# Patient Record
Sex: Female | Born: 1985 | Race: White | Hispanic: No | Marital: Married | Smoking: Never smoker
Health system: Southern US, Community
[De-identification: ages and names within clinical notes are randomized; demographics above are authoritative.]

## PROBLEM LIST (undated history)

## (undated) DIAGNOSIS — E282 Polycystic ovarian syndrome: Secondary | ICD-10-CM

## (undated) DIAGNOSIS — E119 Type 2 diabetes mellitus without complications: Secondary | ICD-10-CM

## (undated) DIAGNOSIS — T8859XA Other complications of anesthesia, initial encounter: Secondary | ICD-10-CM

## (undated) DIAGNOSIS — N979 Female infertility, unspecified: Secondary | ICD-10-CM

## (undated) HISTORY — DX: Female infertility, unspecified: N97.9

## (undated) HISTORY — DX: Type 2 diabetes mellitus without complications: E11.9

## (undated) HISTORY — DX: Polycystic ovarian syndrome: E28.2

## (undated) HISTORY — DX: Other complications of anesthesia, initial encounter: T88.59XA

## (undated) HISTORY — PX: DILATION AND CURETTAGE OF UTERUS: SHX78

---

## 2020-05-23 ENCOUNTER — Other Ambulatory Visit: Payer: Self-pay

## 2020-06-08 ENCOUNTER — Other Ambulatory Visit: Payer: Self-pay | Admitting: Obstetrics and Gynecology

## 2020-06-08 DIAGNOSIS — O24112 Pre-existing diabetes mellitus, type 2, in pregnancy, second trimester: Secondary | ICD-10-CM

## 2020-06-11 ENCOUNTER — Other Ambulatory Visit: Payer: Self-pay

## 2020-06-11 ENCOUNTER — Ambulatory Visit: Payer: BC Managed Care – PPO

## 2020-06-11 DIAGNOSIS — O24112 Pre-existing diabetes mellitus, type 2, in pregnancy, second trimester: Secondary | ICD-10-CM

## 2020-06-19 ENCOUNTER — Other Ambulatory Visit: Payer: Self-pay

## 2020-07-03 ENCOUNTER — Other Ambulatory Visit: Payer: Self-pay | Admitting: Obstetrics and Gynecology

## 2020-07-03 DIAGNOSIS — Z363 Encounter for antenatal screening for malformations: Secondary | ICD-10-CM

## 2020-07-08 ENCOUNTER — Encounter: Payer: Self-pay | Admitting: *Deleted

## 2020-07-09 ENCOUNTER — Other Ambulatory Visit: Payer: Self-pay

## 2020-07-09 ENCOUNTER — Ambulatory Visit: Payer: BC Managed Care – PPO | Attending: Obstetrics and Gynecology | Admitting: Genetic Counselor

## 2020-07-09 ENCOUNTER — Ambulatory Visit: Payer: BC Managed Care – PPO | Attending: Obstetrics and Gynecology

## 2020-07-09 ENCOUNTER — Encounter: Payer: Self-pay | Admitting: *Deleted

## 2020-07-09 ENCOUNTER — Ambulatory Visit: Payer: BC Managed Care – PPO | Admitting: *Deleted

## 2020-07-09 VITALS — BP 129/78 | HR 109 | Ht 62.0 in

## 2020-07-09 DIAGNOSIS — Z3A20 20 weeks gestation of pregnancy: Secondary | ICD-10-CM

## 2020-07-09 DIAGNOSIS — O283 Abnormal ultrasonic finding on antenatal screening of mother: Secondary | ICD-10-CM | POA: Insufficient documentation

## 2020-07-09 DIAGNOSIS — Z363 Encounter for antenatal screening for malformations: Secondary | ICD-10-CM | POA: Insufficient documentation

## 2020-07-09 DIAGNOSIS — O99212 Obesity complicating pregnancy, second trimester: Secondary | ICD-10-CM | POA: Diagnosis not present

## 2020-07-09 DIAGNOSIS — O24112 Pre-existing diabetes mellitus, type 2, in pregnancy, second trimester: Secondary | ICD-10-CM

## 2020-07-09 DIAGNOSIS — E669 Obesity, unspecified: Secondary | ICD-10-CM

## 2020-07-09 DIAGNOSIS — O358XX0 Maternal care for other (suspected) fetal abnormality and damage, not applicable or unspecified: Secondary | ICD-10-CM

## 2020-07-09 DIAGNOSIS — Z315 Encounter for genetic counseling: Secondary | ICD-10-CM

## 2020-07-09 NOTE — Progress Notes (Signed)
ADDENDUM (07/10/20): I called Ms. Zetino to check in on her following our discussion yesterday. Ms. Wales informed me that she and her husband have decided not to undergo amniocentesis and will be opting to end the pregnancy. Ultimately, Ms. Tsutsui did not want her son to have poor quality of life. The uncertainty surrounding his prognosis left her needing to consider and make decisions based upon the "worst case scenario". She especially had concerns about impacts on his learning and development, which unfortunately cannot be predicted during the prenatal period. I validated Ms. Osinski thoughts and feelings and reinforced that she is the one who knows the best decision that can be made under these circumstances.   Ms. Rekowski inquired about the next steps in the process of terminating the pregnancy. We discussed that termination of pregnancy may occur through induction of labor or a dilation & evacuation (D&E) procedure. We reviewed the technical aspects of the D&E and induction procedures as well as associated benefits, limitations, and risks associated with each method. We also discussed locations around [place] that perform termination of pregnancy procedures. Ms. Aja opted to have a D&E with Dr. Sabra Heck at [facility]. She gave me permission to reach out to [facility] and provide them with her contact information to schedule an appointment for the procedure.   I offered Ms. Fetting support resources for individuals who have chosen to terminate a wanted pregnancy for medical reasons, which she expressed interest in. Following our phone call, I emailed her information about the websites A Heartbreaking Choice and Ending a Wanted Pregnancy. Ms. Esty confirmed that she had no further questions. I encouraged her to contact me if there is anything else I can do to be helpful during this difficult  time.  ----------------------------------------------------------------------------------------------------------------------  07/08/2020  Annamaria Helling [birth year] MRN: [id number] DOV: 07/09/2020  Ms. Kaluzny presented to the [facility] for a genetics consultation regarding fetal anomalies identified on ultrasound. Ms. Vanvorst was accompanied to the latter half of her appointment by her husband.   Indication for genetic counseling - Fetal anomalies identified on ultrasound  Prenatal history  Ms. Lainez is a G61P1011, 35 y.o. female. Her current pregnancy has completed [redacted]w[redacted]d(Estimated Date of Delivery: 11/25/20). Ms. SChovanand her husband have one healthy daughter together. Ms. SLangsamhas also had one prior miscarriage requiring a D&E. Given the nature of today's appointment, additional prenatal history was not reviewed in detail.   Family History  Given the nature of today's appointment, a three generation pedigree was not drafted and comprehensive family history was not reviewed. The patient and her husband are both Caucasian.  Discussion  I met with Ms. Cueto at Dr. BShon Batonrequest to discuss findings from her anatomy ultrasound. A complete ultrasound was performed today prior to our visit. The ultrasound report will be sent under separate cover. An absent right hand, fetal growth restriction, micrognathia, a membranous ventricular septal defect, polyhydramnios, and a marginal cord insertion were identified on today's ultrasound.  Possible causes:  Ms. SAlongewas counseled that since multiple anomalies are seen in conjunction with one another on ultrasound, it is possible that the fetus may have an underlying genetic condition. We discussed possible differentials that can be associated with the findings on ultrasound. One condition on the list of differential diagnoses is Cornelia de Lange syndrome (CdLS). We discussed that this is a condition characterized by  both pre- and postnatal growth delays, abnormalities of the head and face (including micrognathia), upper  limb malformations (including absent hands), and intellectual disabilities ranging from mild to severe. Infants with CdLS may also have feeding and breathing difficulties, an increased susceptibility to respiratory infections, congenital heart defects, delayed skeletal maturation, hearing loss, or other physical abnormalities. The range and severity of associated symptoms may be extremely variable from one affected person to another. We discussed that severity often cannot be predicted during the prenatal period.   CdLS is caused by changes in several different genes. Approximately 60% of individuals with CdLS have pathogenic variants in the NIPBL gene, with 10% having pathogenic variants in other genes. Approximately 30% of individuals clinically diagnosed with CdLS do not have a pathogenic variant in one of the known genes, suggesting that other genes may be found to be associated with CdLS in the future. CdLS most often occurs de novo, meaning that an individual is the first one in their family to be affected. CdLS can be inherited in an autosomal dominant or X-linked fashion.  We reviewed that the features on today's ultrasound overlap with many features of CdLS. However, this condition must be diagnosed clinically after birth or through molecular testing. Additionally, we discussed that it is possible that the features identified on today's ultrasound could be related to a genetic condition other than CdLS, such as a chromosomal abnormality. Ms. Riedlinger understands that prognosis depends on the underlying cause of the ultrasound anomalies.  Diagnostic testing:  Ms. Strieter was counseled regarding diagnostic testing via amniocentesis. We discussed the technical aspects of the procedure and quoted up to a 1 in 500 (0.2%) risk for spontaneous pregnancy loss or other adverse pregnancy outcomes as a result  of amniocentesis. Cultured cells from an amniocentesis sample allow for the visualization of a fetal karyotype, which can detect >99% of large chromosomal aberrations. Chromosomal microarray can also be performed to identify smaller deletions or duplications of fetal chromosomal material. Amniocentesis could also be performed to assess whether the fetus is affected by a single gene condition via a gene sequencing panel. For example, a multigene panel containing genes associated with CdLS could be ordered.   We also briefly discussed the possibility of a whole exome sequencing research study offered by [facility]. Whole exome sequencing (WES) is a technology that sequences every protein-coding gene in the body. We discussed that if chromosomal testing were negative, WES would be the follow-up option with the best chance at finding a genetic etiology for the fetus's ultrasound findings. If Ms. Paulette were interested in this study and wanted to pursue an amniocentesis, we could sign a release form for a portion of her amniotic fluid to be sent from [facility] to [facility] for free WES analysis.   Ms. Layne was counseled on the benefits of amniocentesis. We discussed that if results were to come back positive, it could impact pregnancy management in several different ways. We discussed that some individuals may choose to end a pregnancy or consider adoption if a genetic condition were confirmed in the fetus. For individuals who would not alter their pregnancy management regardless of testing outcomes, a prenatal diagnosis could allow for delivery planing and prenatal consults with specialists that would be involved in the infant's care, and time to plan and prepare emotionally, physically, and financially. Additionally, diagnostic testing could help to inform recurrence risks for future pregnancies if a genetic etiology were identified.  Ms. Meadowcroft was also counseled on the limitations of amniocentesis. We discussed that  negative results on an amniocentesis would not rule out a genetic condition for  the fetus. Chromosomal testing and a multigene sequencing panel cannot assess for all possible genetic conditions that may be associated with the ultrasound findings. Additionally, a negative result on a CdSL-specific multigene panel would not rule out a diagnosis of CdLS since 30% of affected individuals have no known genetic cause. Finally, results from amniocentesis would likely not be returned in time for Ms. Mynhier to decide to terminate a pregnancy in the state of [place].   Pregnancy management options:  We reviewed Ms. Wallington's pregnancy management options in detail. Firstly, she has the option of continuing the pregnancy. Secondly, Ms. Hann has the option of pursuing adoption. Lastly, Ms. Yan has the option of ending the pregnancy. Ms. Wake was informed that it is an option to end a pregnancy until 20-21 weeks' gestation in the state of [place] depending on the clinic. Termination is still an option beyond 21 weeks' gestation in other states. Finally, we reviewed [place]'s 72 hour consent law for termination procedures.   Ms. Fabry disclosed that she is leaning toward terminating the pregnancy. This is an extremely difficult decision for her, as this pregnancy is deeply desired. Ms. Kimm and her husband have been trying to conceive for eight years. However, she must consider the unknowns surrounding her baby's possible quality of life/prognosis in addition to the impacts that caring for a child with special needs would have on her daughter and the rest of her family. Ms. Knock signed the consent form for the Women's Right to Know Act in case she decides to pursue termination of pregnancy.   Plan:  Ms. Cogle was appropriately emotional today. She and her husband requested to go home and process the information presented today before making a definitive decision regarding amniocentesis or  termination of pregnancy. While she is considering both amniocentesis and termination of pregnancy, she is struggling with making the "wrong" decision. We made a plan for me to call Ms. Mette tomorrow to check in on how she's feeling and see if she had made any decisions or had further questions after having some time to further consider her options.  I provided Ms. Cregger with resources on Cornelia de Lange syndrome at her request, again stressing that this is not the definitive diagnosis for the fetus.   I counseled Ms. Kloth regarding the above risks and available options. The approximate face-to-face time with the genetic counselor was 40 minutes.  In summary:  Reviewed results of ultrasound  Absent right hand, fetal growth restriction, micrognathia, VSD, polyhydramnios, and marginal cord insertion identified  Discussed ultrasound findings, including information about possible causes and prognosis  Genetic syndrome such as Cornelia de Lange or chromosomal abnormality possible  Prognosis depends on underlying cause and may not be able to be determined prenatally  Offered additional testing and screening  Considering amniocentesis and termination of pregnancy. Results from amniocentesis likely will not be available before deadline for termination of pregnancy in [place]. I will contact patient tomorrow to see if she has made any decisions    Buelah Manis, MS, [place]

## 2021-03-30 LAB — OB RESULTS CONSOLE HIV ANTIBODY (ROUTINE TESTING): HIV: NONREACTIVE

## 2021-03-30 LAB — OB RESULTS CONSOLE RUBELLA ANTIBODY, IGM: Rubella: IMMUNE

## 2021-03-30 LAB — OB RESULTS CONSOLE VARICELLA ZOSTER ANTIBODY, IGG: Varicella: IMMUNE

## 2021-03-30 LAB — OB RESULTS CONSOLE GC/CHLAMYDIA
Chlamydia: NEGATIVE
Neisseria Gonorrhea: NEGATIVE

## 2021-03-30 LAB — OB RESULTS CONSOLE HEPATITIS B SURFACE ANTIGEN: Hepatitis B Surface Ag: NEGATIVE

## 2021-05-18 ENCOUNTER — Other Ambulatory Visit: Payer: Self-pay | Admitting: Obstetrics and Gynecology

## 2021-05-18 DIAGNOSIS — Z363 Encounter for antenatal screening for malformations: Secondary | ICD-10-CM

## 2021-05-20 ENCOUNTER — Encounter: Payer: Self-pay | Admitting: *Deleted

## 2021-05-27 ENCOUNTER — Other Ambulatory Visit: Payer: Self-pay

## 2021-05-27 ENCOUNTER — Ambulatory Visit: Payer: BC Managed Care – PPO | Attending: Obstetrics and Gynecology

## 2021-05-27 ENCOUNTER — Ambulatory Visit: Payer: BC Managed Care – PPO | Admitting: *Deleted

## 2021-05-27 ENCOUNTER — Other Ambulatory Visit: Payer: Self-pay | Admitting: *Deleted

## 2021-05-27 ENCOUNTER — Encounter: Payer: Self-pay | Admitting: *Deleted

## 2021-05-27 ENCOUNTER — Ambulatory Visit (HOSPITAL_BASED_OUTPATIENT_CLINIC_OR_DEPARTMENT_OTHER): Payer: BC Managed Care – PPO | Admitting: Obstetrics and Gynecology

## 2021-05-27 VITALS — BP 124/69 | HR 89

## 2021-05-27 DIAGNOSIS — O24312 Unspecified pre-existing diabetes mellitus in pregnancy, second trimester: Secondary | ICD-10-CM | POA: Diagnosis not present

## 2021-05-27 DIAGNOSIS — O24112 Pre-existing diabetes mellitus, type 2, in pregnancy, second trimester: Secondary | ICD-10-CM | POA: Diagnosis present

## 2021-05-27 DIAGNOSIS — O3503X0 Maternal care for (suspected) central nervous system malformation or damage in fetus, choroid plexus cysts, not applicable or unspecified: Secondary | ICD-10-CM

## 2021-05-27 DIAGNOSIS — O09299 Supervision of pregnancy with other poor reproductive or obstetric history, unspecified trimester: Secondary | ICD-10-CM

## 2021-05-27 DIAGNOSIS — O24419 Gestational diabetes mellitus in pregnancy, unspecified control: Secondary | ICD-10-CM

## 2021-05-27 DIAGNOSIS — Z363 Encounter for antenatal screening for malformations: Secondary | ICD-10-CM

## 2021-05-27 DIAGNOSIS — Z3A18 18 weeks gestation of pregnancy: Secondary | ICD-10-CM | POA: Insufficient documentation

## 2021-05-27 DIAGNOSIS — O99212 Obesity complicating pregnancy, second trimester: Secondary | ICD-10-CM | POA: Diagnosis not present

## 2021-05-27 DIAGNOSIS — R638 Other symptoms and signs concerning food and fluid intake: Secondary | ICD-10-CM

## 2021-05-27 DIAGNOSIS — Z3689 Encounter for other specified antenatal screening: Secondary | ICD-10-CM

## 2021-05-27 NOTE — Progress Notes (Signed)
Maternal-Fetal Medicine   Name: Caitlin Downs DOB: 09-01-1985 MRN: [id number] Referring Provider: Rhoderick Moody, MD  I had the [facility]. She is G4 P1021 at 19w 5d gestation and is here for fetal anatomy scan and consultation. Her problems include: -Pregestational diabetes. -History of fetal anomaly  Past medical history significant for type 2 diabetes.  In November 2020, her hemoglobin A1c was 9.4%.  Her most recent hemoglobin A1c (September 2022) is 5.5%.  Patient takes metformin XR 500 mg daily and reports her fasting and postprandial levels are within normal range.  She checks her blood glucose regularly.  Patient had ophthalmology examination last year and does not have proliferative retinopathy.  She does not have neuropathy or nephropathy. She does not have hypertension or thyroid disorders or any other chronic medical conditions. Past surgical history: DND Medications: Prenatal vitamins, metformin, Zyrtec as needed. Allergies: No known drug allergies. Social history: Denies tobacco or drug or alcohol use.  She has been married 10 years and her husband is in good health.  He is a father of her first child. Family history: No history of venous thromboembolism in the family. Obstetric history 06/2007: Term vaginal delivery of a female infant weighing 6 pounds and 2 ounces at birth.  Her daughter is in good health. -In March 2022 at [redacted] weeks gestation, multiple fetal anomalies including micrognathia, ventricular septal defect, polyhydramnios, absent right ulnar and radius were seen.  Patient had opted not to have amniocentesis.  She had D&E procedure performed at [facility].  She declined postnatal genetic studies. GYN history: No history of abnormal Pap smears or cervical surgeries.  Prenatal course: Her pregnancy is well dated by 7-week ultrasound performed at your office.  On cell free fetal DNA  screening, the risks of fetal aneuploidies are not increased.  Blood pressure today at her office is 124/69 mmHg.  Ultrasound We performed fetal anatomical survey.  Amniotic fluid is normal and good fetal activity seen.  Fetal biometry is consistent with the previously established dates.  Bilateral choroid plexus cysts were seen.  No other markers of aneuploidies or fetal structural defects are seen.  Both arms were visible and appeared normal.  Fetal profile appears normal.  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  Increased hemoglobin A1c in November 2020 clearly establishes pregestational diabetes and not gestational diabetes. -I reassured the patient that low hemoglobin A1c is associated with a lower incidence of congenital malformations (2% to 3%).  I recommended fetal echocardiography.  Patient reports that your office had set up an appointment for fetal echocardiography. -I discussed the importance of checking her blood glucose regularly and discussed the normal parameters.  Patient seems very well motivated. -Complications of poorly controlled diabetes include fetal macrosomia leading to shoulder dystocia and birth injuries, neonatal respiratory distress syndrome and NICU admissions.  Poorly controlled diabetes can also lead to stillbirth. -Metformin can be safely given in pregnancy and if diabetes is not well controlled on oral hypoglycemics, insulin should be initiated. -I discussed our ultrasound protocol of serial fetal growth assessments and weekly BPP from [redacted] weeks gestation till delivery. -Delivery should be considered at [redacted] weeks gestation provided diabetes is well controlled.  If diabetes is not well controlled, early term delivery (37- or 38-weeks gestation) may be considered. -Type 2 diabetes associated with increased risk of gestational hypertension/preeclampsia.  I discussed the benefit of low-dose aspirin in delaying or preventing preeclampsia.  I encouraged her to take aspirin 81 mg daily till delivery.  Bilateral choroid plexus cysts (CPC) I counseled the patient that isolated CPC is only rarely associated with chromosomal anomaly (trisomy 18). I also reassured her that [facility] is not associated with structural malformations in the brain. CPCs usually resolve with advancing gestation.  Given that she had low risk for trisomy 18 on cell free fetal DNA screening, this should not be considered a marker for trisomy 89.  I do not recommend amniocentesis for this finding. I reassured the patient of normal otherwise fetal anatomical survey today.  Recommendations -An appointment was made for her to return in 4 weeks for fetal growth assessment and revisit facial anatomy (normal but suboptimal views on today's ultrasound). -Fetal growth assessments every 4 weeks that may be performed at your office. -Weekly BPP from [redacted] weeks gestation till delivery. -Aspirin 81 mg daily till delivery. -Your office as requested appointment for fetal echocardiography.  Kindly fax [place] the reports when available.  Thank you for consultation.  If you have any questions or concerns, please contact me the [facility].  Consultation including face-to-face (more than 50%) counseling 30 minutes.

## 2021-06-21 ENCOUNTER — Ambulatory Visit: Payer: BC Managed Care – PPO | Admitting: *Deleted

## 2021-06-21 ENCOUNTER — Other Ambulatory Visit: Payer: Self-pay

## 2021-06-21 ENCOUNTER — Other Ambulatory Visit: Payer: Self-pay | Admitting: *Deleted

## 2021-06-21 ENCOUNTER — Encounter: Payer: Self-pay | Admitting: *Deleted

## 2021-06-21 ENCOUNTER — Ambulatory Visit: Payer: BC Managed Care – PPO | Attending: Obstetrics and Gynecology

## 2021-06-21 VITALS — BP 124/72 | HR 91

## 2021-06-21 DIAGNOSIS — Z6833 Body mass index (BMI) 33.0-33.9, adult: Secondary | ICD-10-CM

## 2021-06-21 DIAGNOSIS — O24419 Gestational diabetes mellitus in pregnancy, unspecified control: Secondary | ICD-10-CM | POA: Diagnosis present

## 2021-06-21 DIAGNOSIS — O3503X0 Maternal care for (suspected) central nervous system malformation or damage in fetus, choroid plexus cysts, not applicable or unspecified: Secondary | ICD-10-CM | POA: Insufficient documentation

## 2021-06-21 DIAGNOSIS — Z3689 Encounter for other specified antenatal screening: Secondary | ICD-10-CM

## 2021-06-21 DIAGNOSIS — O24119 Pre-existing diabetes mellitus, type 2, in pregnancy, unspecified trimester: Secondary | ICD-10-CM

## 2021-06-21 DIAGNOSIS — O24112 Pre-existing diabetes mellitus, type 2, in pregnancy, second trimester: Secondary | ICD-10-CM

## 2021-06-21 DIAGNOSIS — Z3A22 22 weeks gestation of pregnancy: Secondary | ICD-10-CM | POA: Diagnosis not present

## 2021-06-21 DIAGNOSIS — R638 Other symptoms and signs concerning food and fluid intake: Secondary | ICD-10-CM | POA: Insufficient documentation

## 2021-07-19 ENCOUNTER — Ambulatory Visit: Payer: BC Managed Care – PPO

## 2021-07-19 ENCOUNTER — Ambulatory Visit: Payer: BC Managed Care – PPO | Attending: Obstetrics and Gynecology

## 2021-08-27 ENCOUNTER — Telehealth: Payer: Self-pay

## 2021-08-27 NOTE — Telephone Encounter (Signed)
NOTES SCANNED TO REFERRAL 

## 2021-09-06 ENCOUNTER — Ambulatory Visit: Payer: BC Managed Care – PPO | Admitting: Cardiology

## 2021-09-15 ENCOUNTER — Encounter: Payer: Self-pay | Admitting: *Deleted

## 2021-09-16 NOTE — Progress Notes (Signed)
? ?ID:  Caitlin Downs, DOB [birth year], MRN [id number] ? ?PCP:  Katherina Mires, MD  ?Cardiologist:  Rex Kras, DO, [facility] (established care 09/17/2021) ? ?REASON FOR CONSULT: Cardiac evaluation.  ? ?REQUESTING PHYSICIAN:  ?Charyl Bigger, MD ?[address] ?[place],  [place] [address] ? ?Chief Complaint  ?Patient presents with  ? New Patient (Initial Visit)  ?  Establish care, cardiac evaluation  ? ? ?HPI  ?Caitlin Downs is a 36 y.o. Caucasian female whose past medical history and cardiovascular risk factors include: Polycystic ovary syndrome, non-insulin-dependent type 2 diabetes.  ? ?She is referred to the office at the request of Almquist, Earlyne Iba, MD for evaluation of cardiac evaluation. ? ?She is G4P1011 and at her 34th week of pregnancy presents to the cardiology clinic to establish care at the request of her OB/GYN.  Her first pregnancy was unremarkable and she gave birth to a very healthy baby girl.  Patient informs me that the second pregnancy was terminated due to spontaneous abortion.  In the third pregnancy the fetus was noted to have multiple cardiac anomalies and they chose to terminate the pregnancy.  She is currently 34 weeks into the current pregnancy with a tentative delivery date to be October 25, 2021. ? ?From a cardiovascular standpoint she denies any chest pain, shortness of breath, lightheadedness, dizziness, near-syncope or syncope, or heart failure symptoms. ? ?Blood pressure are well controlled per patient and patient has been cognizant with regards to her glycemic control.  She states that as she progresses in pregnancy is requiring more metformin to have adequate blood glucose level. ? ?She was evaluated by Dr. Sandria Manly, at [facility] from pediatric cardiology and underwent a fetal echo results reviewed in [place]. ? ?ALLERGIES: ?No Known Allergies ? ?MEDICATION LIST PRIOR TO VISIT: ?Current Meds  ?Medication Sig  ? acetaminophen (TYLENOL) 325 MG tablet Take 1 tablet by  mouth as needed.  ? aspirin 81 MG EC tablet Take 1 tablet by mouth daily.  ? cetirizine (ZYRTEC) 10 MG tablet Take 10 mg by mouth daily.  ? metFORMIN (GLUCOPHAGE) 500 MG tablet Take 500 mg by mouth 2 (two) times daily with a meal.  ? Prenatal Vit-Fe Fumarate-FA (PRENATAL VITAMINS PO) Take by mouth.  ?  ? ?PAST MEDICAL HISTORY: ?Past Medical History:  ?Diagnosis Date  ? Complication of anesthesia   ? Diabetes mellitus without complication ([place])   ? Type 2  ? Infertility, female   ? PCOS (polycystic ovarian syndrome)   ? ? ?PAST SURGICAL HISTORY: ?Past Surgical History:  ?Procedure Laterality Date  ? DILATION AND CURETTAGE OF UTERUS    ? ? ?FAMILY HISTORY: ?The patient family history includes Arthritis in her mother; COPD in her mother; Diabetes in her brother and father; Heart attack (age of onset: 16) in her father; Heart disease in her father and mother; Hypertension in her brother, brother, and mother. ? ?SOCIAL HISTORY:  ?The patient  reports that she has never smoked. She has never used smokeless tobacco. She reports that she does not drink alcohol and does not use drugs. ? ?REVIEW OF SYSTEMS: ?Review of Systems  ?Cardiovascular:  Negative for chest pain, cyanosis, dyspnea on exertion, leg swelling, orthopnea, palpitations, paroxysmal nocturnal dyspnea and syncope.  ?Respiratory:  Negative for shortness of breath and wheezing.   ?Hematologic/Lymphatic: Negative for bleeding problem.  ? ?PHYSICAL EXAM: ? ?  09/17/2021  ?  8:59 AM 06/21/2021  ? 10:27 AM 05/27/2021  ?  7:31 AM  ?Vitals with  BMI  ?Height 5' 2"     ?Weight 195 lbs 10 oz    ?BMI 35.77    ?Systolic 440 102 725  ?Diastolic 86 72 69  ?Pulse 102 91 89  ? ? ?CONSTITUTIONAL: Well-developed and well-nourished. No acute distress.  ?SKIN: Skin is warm and dry. No rash noted. No cyanosis. No pallor. No jaundice ?HEAD: Normocephalic and atraumatic.  ?EYES: No scleral icterus ?MOUTH/THROAT: Moist oral membranes.  ?NECK: No JVD present. No thyromegaly noted. No  carotid bruits  ?CHEST Normal respiratory effort. No intercostal retractions  ?LUNGS: Clear to auscultation bilaterally.  No stridor. No wheezes. No rales.  ?CARDIOVASCULAR: Regular, tachycardic, positive S1-S2, no murmurs rubs or gallops appreciated.  ?ABDOMINAL: Soft, gravid uterus, positive bowel sounds in all 4 quadrants, No apparent ascites.  ?EXTREMITIES: No peripheral edema, warm to touch, 2+ bilateral DP and PT pulses ?HEMATOLOGIC: No significant bruising ?NEUROLOGIC: Oriented to person, place, and time. Nonfocal. Normal muscle tone.  ?PSYCHIATRIC: Normal mood and affect. Normal behavior. Cooperative ? ?CARDIAC DATABASE: ?EKG: ?09/17/2021: Sinus  Rhythm, 100bpm, nonspecific T wave changes, without underyling injury pattern. ? ?Echocardiogram: ?06/11/2020: ?ECG: Underlying rhythm predominately sinus tachycardia followed by normal sinus. ?Normal LV systolic function with visual EF 60-65%. Left ventricle cavity is normal in size. Normal global wall motion. Normal diastolic filling pattern, normal LAP. ?No significant valvular heart disease. ?No prior study for comparison. ?  ?Stress Testing: ?No results found for this or any previous visit from the past 1095 days. ? ? ?Heart Catheterization: ?None ? ?LABORATORY DATA: ?   ? View : No data to display.  ?  ?  ?  ? ? ?   ? View : No data to display.  ?  ?  ?  ? ? ?Lipid Panel  ?No results found for: CHOL, TRIG, HDL, CHOLHDL, VLDL, LDLCALC, LDLDIRECT, LABVLDL ? ?No components found for: NTPROBNP ?No results for input(s): PROBNP in the last 8760 hours. ?No results for input(s): TSH in the last 8760 hours. ? ?BMP ?No results for input(s): NA, K, CL, CO2, GLUCOSE, BUN, CREATININE, CALCIUM, GFRNONAA, GFRAA in the last 8760 hours. ? ?HEMOGLOBIN A1C ?No results found for: HGBA1C, MPG ? ?IMPRESSION: ? ?  ICD-10-CM   ?1. [redacted] weeks gestation of pregnancy  Z3A.34 EKG 12-Lead  ?  ?2. Type 2 diabetes mellitus without complication, without long-term current use of insulin (HCC)   E11.9   ?  ?3. Type 2 diabetes mellitus affecting pregnancy, antepartum  O24.119 [place] MFM OB FOLLOW UP  ?  ?4. BMI 33.0-33.9,adult  Z68.33 [place] MFM OB FOLLOW UP  ?  ?5. Encounter for ultrasound to assess fetal growth  Z36.89 [place] MFM OB FOLLOW UP  ?  ?  ? ?RECOMMENDATIONS: ?Caitlin Downs is a 36 y.o. Caucasian female whose past medical history and cardiac risk factors include: Polycystic ovary syndrome, non-insulin-dependent type 2 diabetes.  ? ?Very pleasant 36 year old female who is currently G4 P1-0-1-1 and plans to have a baby boy via C-section on October 25, 2021 which is her estimated delivery date.  She will be having a delivery at [facility].  Her first pregnancy was unremarkable as noted above during which time she gave birth to a baby girl.  Her oldest daughter is 60 years old and no cardiac complications/history. ? ?She also had a fetal echo and met pediatric cardiologist at [facility] in February 2023 results reviewed in [place].  And patient herself had an echo at our practice in February which noted preserved LVEF,  no significant valvular heart disease.  EKG today shows sinus rhythm with nonspecific T wave changes. ? ?She denies any anginal discomfort or heart failure symptoms. ? ?No additional work-up or testing required at this time. ? ?FINAL MEDICATION LIST END OF ENCOUNTER: ?No orders of the defined types were placed in this encounter. ?  ?There are no discontinued medications.  ? ?Current Outpatient Medications:  ?  acetaminophen (TYLENOL) 325 MG tablet, Take 1 tablet by mouth as needed., Disp: , Rfl:  ?  aspirin 81 MG EC tablet, Take 1 tablet by mouth daily., Disp: , Rfl:  ?  cetirizine (ZYRTEC) 10 MG tablet, Take 10 mg by mouth daily., Disp: , Rfl:  ?  metFORMIN (GLUCOPHAGE) 500 MG tablet, Take 500 mg by mouth 2 (two) times daily with a meal., Disp: , Rfl:  ?  Prenatal Vit-Fe Fumarate-FA (PRENATAL VITAMINS PO), Take by mouth., Disp: , Rfl:  ? ?Orders Placed This Encounter  ?Procedures  ? EKG  12-Lead  ? ? ?There are no Patient Instructions on file for this visit.  ? ?--Continue cardiac medications as reconciled in final medication list. ?--Return in about 12 weeks (around 12/10/2021) for Follow up p

## 2021-09-17 ENCOUNTER — Ambulatory Visit: Payer: BC Managed Care – PPO | Admitting: Cardiology

## 2021-09-17 ENCOUNTER — Encounter: Payer: Self-pay | Admitting: Cardiology

## 2021-09-17 VITALS — BP 132/86 | HR 102 | Temp 97.8°F | Resp 17 | Ht 62.0 in | Wt 195.6 lb

## 2021-09-17 DIAGNOSIS — E119 Type 2 diabetes mellitus without complications: Secondary | ICD-10-CM

## 2021-09-17 DIAGNOSIS — Z3A34 34 weeks gestation of pregnancy: Secondary | ICD-10-CM

## 2021-09-17 DIAGNOSIS — O24119 Pre-existing diabetes mellitus, type 2, in pregnancy, unspecified trimester: Secondary | ICD-10-CM

## 2021-09-17 DIAGNOSIS — Z3689 Encounter for other specified antenatal screening: Secondary | ICD-10-CM

## 2021-09-17 DIAGNOSIS — Z6833 Body mass index (BMI) 33.0-33.9, adult: Secondary | ICD-10-CM

## 2021-09-27 ENCOUNTER — Encounter (HOSPITAL_COMMUNITY): Payer: Self-pay | Admitting: Obstetrics and Gynecology

## 2021-09-27 ENCOUNTER — Other Ambulatory Visit: Payer: Self-pay

## 2021-09-27 ENCOUNTER — Inpatient Hospital Stay (HOSPITAL_COMMUNITY)
Admission: AD | Admit: 2021-09-27 | Discharge: 2021-09-27 | Disposition: A | Discharge status: Home / Self Care | Payer: BC Managed Care – PPO | Attending: Obstetrics and Gynecology | Admitting: Obstetrics and Gynecology

## 2021-09-27 DIAGNOSIS — Z3689 Encounter for other specified antenatal screening: Secondary | ICD-10-CM

## 2021-09-27 DIAGNOSIS — O471 False labor at or after 37 completed weeks of gestation: Secondary | ICD-10-CM | POA: Diagnosis not present

## 2021-09-27 DIAGNOSIS — O4703 False labor before 37 completed weeks of gestation, third trimester: Secondary | ICD-10-CM | POA: Diagnosis present

## 2021-09-27 DIAGNOSIS — Z3A36 36 weeks gestation of pregnancy: Secondary | ICD-10-CM | POA: Insufficient documentation

## 2021-09-27 NOTE — MAU Provider Note (Signed)
Patient was assessed for active labor and managed by nursing staff during this encounter. Cervical exam unchanged after an hour of observation. I have reviewed the chart and agree with the documentation and plan. I have also reviewed the NST for appropriate reactivity.  Fetal Tracing: reactive Baseline: 140 Variability: moderate Accelerations: 15x15  Decelerations: none Toco: q4-60min   Patient stable for discharge home with labor precautions and instructions on [employer].  Edd Arbour, CNM, MSN, IBCLC Certified Nurse Midwife, [facility] 09/27/21 11:33 PM

## 2021-09-27 NOTE — Discharge Instructions (Signed)
The [employer]  This circuit takes at least 90 minutes to complete so clear your schedule and make mental preparations so you can relax in your environment. The second step requires a lot of pillows so gather them up before beginning Before starting, you should empty your bladder! Have a nice drink nearby, and make sure it has a straw! If you are having contractions, this circuit should be done through contractions, try not to change positions between steps Before you begin...  "I named this 'circuit' after my friend Caitlin Downs, who shared and discussed it with me when I was working with a client whose labor seemed to be stalled out and no longer progressing... This circuit is useful to help get the baby lined up, ideally, in the "Left Occiput Anterior" (LOA) Position, both before labor begins and when some corrections need to be done during labor. Prenatally, this position set can help to rotate a baby. As a natural method of induction, this can help get things going if baby just needed a gentle nudge of position to set things off. To the best of my knowledge, this group of positions will not "hurt" a baby that is already lined up correctly." - Caitlin Downs   Step One: Open-knee Chest Stay in this position for 30 minutes, start in cat/cow, then drop your chest as low as you can to the bed or the floor and your bottom as high as you can. Knees should be fairly wide apart, and the angle between the torso/thighs should be wider than 90 degrees. Wiggle around, prop with lots of pillows and use this time to get totally relaxed. This position allows the baby to scoot out of the pelvis a bit and gives them room to rotate, shift their head position, etc. If the pregnant person finds it helpful, careful positioning with a rebozo under the belly, with gentle tension from a support person behind can help maintain this position for the full 30 minutes.  Step Two:Exaggerated Left Side  Lying Roll to your left side, bringing your top leg as high as possible and keeping your bottom leg straight. Roll forward as much as possible, again using a lot of pillows. Sink into the bed and relax some more. If you fall asleep, that's totally okay and you can stay there! If not, stay here for at least another half an hour. Try and get your top right leg up towards your head and get as rolled over onto your belly as much as possible. If you repeat the circuit during labor, try alternating left and right sides. We know the photo the left is actually right side... just flip the image in your head.  Step Three: Moving and Lunges Lunge, walk stairs facing sideways, 2 at a time, (have a spotter downstairs of you!), take a walk outside with one foot on the curb and the other on the street, sit on a birth ball and hula- anything that's upright and putting your pelvis in open, asymmetrical positions. Spend at least 30 minutes doing this one as well to give your baby a chance to move down. If you are lunging or stair or curb walking, you should lunge/walk/go up stairs in the direction that feels better to you. The key with the lunge is that the toes of the higher leg and mom's belly button should be at right angles. Do not lunge over your knee, that closes the pelvis.     Caitlin Downs: Circuit Creator - [email or web address] Caitlin   Downs, CD, BDT (DONA), LCCE, FACCE: Supporting Content - [email or web address] Caitlin Downs: Photography - [email or web address] Caitlin Downs CD/CDT (BAI): Print and Webmaster - [email or web address] [employer] [email or web address]  

## 2021-09-27 NOTE — MAU Note (Signed)
Pt says since 5pm- she has been feeling [facility]'s [facility]- Dr Amado Nash Last sex- not recent

## 2021-10-05 ENCOUNTER — Encounter (HOSPITAL_COMMUNITY): Payer: Self-pay | Admitting: Obstetrics and Gynecology

## 2021-10-05 ENCOUNTER — Other Ambulatory Visit: Payer: Self-pay | Admitting: Obstetrics and Gynecology

## 2021-10-05 ENCOUNTER — Other Ambulatory Visit: Payer: Self-pay

## 2021-10-05 ENCOUNTER — Inpatient Hospital Stay (HOSPITAL_COMMUNITY): Payer: BC Managed Care – PPO | Admitting: Anesthesiology

## 2021-10-05 ENCOUNTER — Encounter (HOSPITAL_COMMUNITY)
Admission: AD | Disposition: A | Discharge status: Home / Self Care | Payer: Self-pay | Source: Home / Self Care | Attending: Obstetrics and Gynecology

## 2021-10-05 ENCOUNTER — Inpatient Hospital Stay (HOSPITAL_COMMUNITY)
Admission: AD | Admit: 2021-10-05 | Discharge: 2021-10-07 | DRG: 788 | Disposition: A | Discharge status: Home / Self Care | Payer: BC Managed Care – PPO | Attending: Obstetrics and Gynecology | Admitting: Obstetrics and Gynecology

## 2021-10-05 DIAGNOSIS — O99214 Obesity complicating childbirth: Secondary | ICD-10-CM | POA: Diagnosis present

## 2021-10-05 DIAGNOSIS — O3663X0 Maternal care for excessive fetal growth, third trimester, not applicable or unspecified: Secondary | ICD-10-CM | POA: Diagnosis present

## 2021-10-05 DIAGNOSIS — O139 Gestational [pregnancy-induced] hypertension without significant proteinuria, unspecified trimester: Secondary | ICD-10-CM | POA: Diagnosis present

## 2021-10-05 DIAGNOSIS — E1165 Type 2 diabetes mellitus with hyperglycemia: Secondary | ICD-10-CM | POA: Diagnosis not present

## 2021-10-05 DIAGNOSIS — O134 Gestational [pregnancy-induced] hypertension without significant proteinuria, complicating childbirth: Secondary | ICD-10-CM | POA: Diagnosis present

## 2021-10-05 DIAGNOSIS — Z7984 Long term (current) use of oral hypoglycemic drugs: Secondary | ICD-10-CM

## 2021-10-05 DIAGNOSIS — O2412 Pre-existing diabetes mellitus, type 2, in childbirth: Secondary | ICD-10-CM | POA: Diagnosis present

## 2021-10-05 DIAGNOSIS — Z3A37 37 weeks gestation of pregnancy: Secondary | ICD-10-CM | POA: Diagnosis not present

## 2021-10-05 DIAGNOSIS — Z98891 History of uterine scar from previous surgery: Secondary | ICD-10-CM

## 2021-10-05 DIAGNOSIS — O24113 Pre-existing diabetes mellitus, type 2, in pregnancy, third trimester: Secondary | ICD-10-CM | POA: Diagnosis present

## 2021-10-05 DIAGNOSIS — E119 Type 2 diabetes mellitus without complications: Secondary | ICD-10-CM

## 2021-10-05 LAB — CBC
HCT: 37.6 % (ref 36.0–46.0)
Hemoglobin: 13.8 g/dL (ref 12.0–15.0)
MCH: 31.2 pg (ref 26.0–34.0)
MCHC: 36.7 g/dL — ABNORMAL HIGH (ref 30.0–36.0)
MCV: 84.9 fL (ref 80.0–100.0)
Platelets: 206 10*3/uL (ref 150–400)
RBC: 4.43 MIL/uL (ref 3.87–5.11)
RDW: 14.5 % (ref 11.5–15.5)
WBC: 9.8 10*3/uL (ref 4.0–10.5)
nRBC: 0 % (ref 0.0–0.2)

## 2021-10-05 LAB — COMPREHENSIVE METABOLIC PANEL
ALT: 27 U/L (ref 0–44)
AST: 40 U/L (ref 15–41)
Albumin: 3.2 g/dL — ABNORMAL LOW (ref 3.5–5.0)
Alkaline Phosphatase: 98 U/L (ref 38–126)
Anion gap: 12 (ref 5–15)
BUN: 5 mg/dL — ABNORMAL LOW (ref 6–20)
CO2: 22 mmol/L (ref 22–32)
Calcium: 8.7 mg/dL — ABNORMAL LOW (ref 8.9–10.3)
Chloride: 104 mmol/L (ref 98–111)
Creatinine, Ser: 0.49 mg/dL (ref 0.44–1.00)
GFR, Estimated: >60 mL/min (ref >60)
Glucose, Bld: 94 mg/dL (ref 70–99)
Potassium: 2.9 mmol/L — ABNORMAL LOW (ref 3.5–5.1)
Sodium: 138 mmol/L (ref 135–145)
Total Bilirubin: 0.7 mg/dL (ref 0.3–1.2)
Total Protein: 6.5 g/dL (ref 6.5–8.1)

## 2021-10-05 LAB — URIC ACID: Uric Acid, Serum: 5.3 mg/dL (ref 2.5–7.1)

## 2021-10-05 LAB — PROTEIN / CREATININE RATIO, URINE
Creatinine, Urine: 36.56 mg/dL
Protein Creatinine Ratio: 0.3 mg/mg{Cre} — ABNORMAL HIGH (ref 0.00–0.15)
Total Protein, Urine: 11 mg/dL

## 2021-10-05 LAB — TYPE AND SCREEN
ABO/RH(D): O POS
Antibody Screen: NEGATIVE

## 2021-10-05 LAB — GLUCOSE, CAPILLARY
Glucose-Capillary: 92 mg/dL (ref 70–99)
Glucose-Capillary: 139 mg/dL — ABNORMAL HIGH (ref 70–99)

## 2021-10-05 SURGERY — Surgical Case
Anesthesia: Spinal

## 2021-10-05 MED ORDER — OXYTOCIN-SODIUM CHLORIDE 30-0.9 UT/500ML-% IV SOLN
2.5000 [IU]/h | INTRAVENOUS | Status: AC
Start: 2021-10-05 — End: 2021-10-06
  Administered 2021-10-05: 2.5 [IU]/h via INTRAVENOUS
  Filled 2021-10-05: qty 500

## 2021-10-05 MED ORDER — MORPHINE SULFATE (PF) 0.5 MG/ML IJ SOLN
INTRAMUSCULAR | Status: AC
  Filled 2021-10-05: qty 10

## 2021-10-05 MED ORDER — TETANUS-DIPHTH-ACELL PERTUSSIS 5-2.5-18.5 LF-MCG/0.5 IM SUSY: 0.5000 mL | PREFILLED_SYRINGE | Freq: Once | INTRAMUSCULAR | Status: DC

## 2021-10-05 MED ORDER — WITCH HAZEL-GLYCERIN EX PADS: 1.0000 "application " | MEDICATED_PAD | CUTANEOUS | Status: DC | PRN

## 2021-10-05 MED ORDER — MORPHINE SULFATE (PF) 0.5 MG/ML IJ SOLN
INTRAMUSCULAR | Status: DC | PRN
  Administered 2021-10-05: .15 mg via INTRATHECAL

## 2021-10-05 MED ORDER — ONDANSETRON HCL 4 MG/2ML IJ SOLN
INTRAMUSCULAR | Status: AC
  Filled 2021-10-05: qty 2

## 2021-10-05 MED ORDER — FENTANYL CITRATE (PF) 100 MCG/2ML IJ SOLN
25.0000 ug | INTRAMUSCULAR | Status: DC | PRN
  Administered 2021-10-05: 25 ug via INTRAVENOUS
  Administered 2021-10-05: 50 ug via INTRAVENOUS

## 2021-10-05 MED ORDER — DEXAMETHASONE SODIUM PHOSPHATE 4 MG/ML IJ SOLN
INTRAMUSCULAR | Status: DC | PRN
  Administered 2021-10-05: 8 mg via INTRAVENOUS

## 2021-10-05 MED ORDER — DIPHENHYDRAMINE HCL 25 MG PO CAPS: 25.0000 mg | ORAL_CAPSULE | Freq: Four times a day (QID) | ORAL | Status: DC | PRN

## 2021-10-05 MED ORDER — OXYCODONE HCL 5 MG PO TABS
5.0000 mg | ORAL_TABLET | ORAL | Status: DC | PRN
  Administered 2021-10-06: 5 mg via ORAL
  Filled 2021-10-05: qty 1

## 2021-10-05 MED ORDER — ACETAMINOPHEN 10 MG/ML IV SOLN: 1000.0000 mg | Freq: Once | INTRAVENOUS | Status: DC | PRN

## 2021-10-05 MED ORDER — METFORMIN HCL 500 MG PO TABS
500.0000 mg | ORAL_TABLET | Freq: Two times a day (BID) | ORAL | Status: DC
  Administered 2021-10-06 – 2021-10-07 (×3): 500 mg via ORAL
  Filled 2021-10-05 (×3): qty 1

## 2021-10-05 MED ORDER — PHENYLEPHRINE HCL-NACL 20-0.9 MG/250ML-% IV SOLN
INTRAVENOUS | Status: DC | PRN
  Administered 2021-10-05: 60 ug/min via INTRAVENOUS

## 2021-10-05 MED ORDER — FENTANYL CITRATE (PF) 100 MCG/2ML IJ SOLN
INTRAMUSCULAR | Status: AC
  Filled 2021-10-05: qty 2

## 2021-10-05 MED ORDER — PRENATAL MULTIVITAMIN CH
1.0000 | ORAL_TABLET | Freq: Every day | ORAL | Status: DC
  Administered 2021-10-06 – 2021-10-07 (×2): 1 via ORAL
  Filled 2021-10-05 (×2): qty 1

## 2021-10-05 MED ORDER — COCONUT OIL OIL
1.0000 "application " | TOPICAL_OIL | Status: DC | PRN
  Administered 2021-10-07: 1 via TOPICAL

## 2021-10-05 MED ORDER — SIMETHICONE 80 MG PO CHEW: 80.0000 mg | CHEWABLE_TABLET | ORAL | Status: DC | PRN

## 2021-10-05 MED ORDER — TRANEXAMIC ACID-NACL 1000-0.7 MG/100ML-% IV SOLN
INTRAVENOUS | Status: AC
  Filled 2021-10-05: qty 100

## 2021-10-05 MED ORDER — POVIDONE-IODINE 10 % EX SWAB
2.0000 "application " | Freq: Once | CUTANEOUS | Status: AC
  Administered 2021-10-05: 2 via TOPICAL

## 2021-10-05 MED ORDER — DIBUCAINE (PERIANAL) 1 % EX OINT: 1.0000 "application " | TOPICAL_OINTMENT | CUTANEOUS | Status: DC | PRN

## 2021-10-05 MED ORDER — PHENYLEPHRINE 80 MCG/ML (10ML) SYRINGE FOR IV PUSH (FOR BLOOD PRESSURE SUPPORT)
PREFILLED_SYRINGE | INTRAVENOUS | Status: AC
  Filled 2021-10-05: qty 10

## 2021-10-05 MED ORDER — TRANEXAMIC ACID-NACL 1000-0.7 MG/100ML-% IV SOLN
INTRAVENOUS | Status: DC | PRN
  Administered 2021-10-05: 1000 mg via INTRAVENOUS

## 2021-10-05 MED ORDER — FENTANYL CITRATE (PF) 100 MCG/2ML IJ SOLN
INTRAMUSCULAR | Status: DC | PRN
Start: 2021-10-05 — End: 2021-10-05
  Administered 2021-10-05: 15 ug via INTRATHECAL

## 2021-10-05 MED ORDER — ACETAMINOPHEN 10 MG/ML IV SOLN
INTRAVENOUS | Status: AC
  Filled 2021-10-05: qty 100

## 2021-10-05 MED ORDER — CEFAZOLIN SODIUM-DEXTROSE 2-4 GM/100ML-% IV SOLN
INTRAVENOUS | Status: AC
  Filled 2021-10-05: qty 100

## 2021-10-05 MED ORDER — SIMETHICONE 80 MG PO CHEW
80.0000 mg | CHEWABLE_TABLET | Freq: Three times a day (TID) | ORAL | Status: DC
  Administered 2021-10-06 – 2021-10-07 (×4): 80 mg via ORAL
  Filled 2021-10-05 (×4): qty 1

## 2021-10-05 MED ORDER — PHENYLEPHRINE HCL-NACL 20-0.9 MG/250ML-% IV SOLN
INTRAVENOUS | Status: AC
  Filled 2021-10-05: qty 250

## 2021-10-05 MED ORDER — DEXAMETHASONE SODIUM PHOSPHATE 4 MG/ML IJ SOLN
INTRAMUSCULAR | Status: AC
  Filled 2021-10-05: qty 2

## 2021-10-05 MED ORDER — BUPIVACAINE IN DEXTROSE 0.75-8.25 % IT SOLN
INTRATHECAL | Status: DC | PRN
  Administered 2021-10-05: 1.6 mL via INTRATHECAL

## 2021-10-05 MED ORDER — LACTATED RINGERS IV SOLN: INTRAVENOUS | Status: DC

## 2021-10-05 MED ORDER — METOCLOPRAMIDE HCL 5 MG/ML IJ SOLN
INTRAMUSCULAR | Status: AC
  Filled 2021-10-05: qty 2

## 2021-10-05 MED ORDER — SENNOSIDES-DOCUSATE SODIUM 8.6-50 MG PO TABS
2.0000 | ORAL_TABLET | Freq: Every day | ORAL | Status: DC
  Administered 2021-10-06 – 2021-10-07 (×2): 2 via ORAL
  Filled 2021-10-05 (×2): qty 2

## 2021-10-05 MED ORDER — IBUPROFEN 600 MG PO TABS
600.0000 mg | ORAL_TABLET | Freq: Four times a day (QID) | ORAL | Status: DC
  Administered 2021-10-05 – 2021-10-07 (×7): 600 mg via ORAL
  Filled 2021-10-05 (×7): qty 1

## 2021-10-05 MED ORDER — ACETAMINOPHEN 10 MG/ML IV SOLN
INTRAVENOUS | Status: DC | PRN
  Administered 2021-10-05: 1000 mg via INTRAVENOUS

## 2021-10-05 MED ORDER — MENTHOL 3 MG MT LOZG: 1.0000 | LOZENGE | OROMUCOSAL | Status: DC | PRN

## 2021-10-05 MED ORDER — LACTATED RINGERS IV SOLN: INTRAVENOUS | Status: DC | PRN

## 2021-10-05 MED ORDER — ONDANSETRON HCL 4 MG/2ML IJ SOLN
INTRAMUSCULAR | Status: DC | PRN
  Administered 2021-10-05: 4 mg via INTRAVENOUS

## 2021-10-05 MED ORDER — OXYTOCIN-SODIUM CHLORIDE 30-0.9 UT/500ML-% IV SOLN
INTRAVENOUS | Status: AC
  Filled 2021-10-05: qty 500

## 2021-10-05 MED ORDER — OXYTOCIN-SODIUM CHLORIDE 30-0.9 UT/500ML-% IV SOLN
INTRAVENOUS | Status: DC | PRN
  Administered 2021-10-05: 300 mL via INTRAVENOUS

## 2021-10-05 MED ORDER — KETOROLAC TROMETHAMINE 30 MG/ML IJ SOLN: 30.0000 mg | Freq: Four times a day (QID) | INTRAMUSCULAR | Status: AC | PRN

## 2021-10-05 MED ORDER — ACETAMINOPHEN 500 MG PO TABS
1000.0000 mg | ORAL_TABLET | Freq: Four times a day (QID) | ORAL | Status: DC
Start: 2021-10-06 — End: 2021-10-07
  Administered 2021-10-06 – 2021-10-07 (×6): 1000 mg via ORAL
  Filled 2021-10-05 (×7): qty 2

## 2021-10-05 MED ORDER — SCOPOLAMINE 1 MG/3DAYS TD PT72
MEDICATED_PATCH | TRANSDERMAL | Status: DC | PRN
  Administered 2021-10-05: 1 via TRANSDERMAL

## 2021-10-05 MED ORDER — METOCLOPRAMIDE HCL 5 MG/ML IJ SOLN
INTRAMUSCULAR | Status: DC | PRN
  Administered 2021-10-05: 10 mg via INTRAVENOUS

## 2021-10-05 MED ORDER — CEFAZOLIN SODIUM-DEXTROSE 2-4 GM/100ML-% IV SOLN
2.0000 g | INTRAVENOUS | Status: AC
  Administered 2021-10-05: 2 g via INTRAVENOUS

## 2021-10-05 SURGICAL SUPPLY — 39 items
BENZOIN TINCTURE PRP APPL 2/3 (GAUZE/BANDAGES/DRESSINGS) ×2 IMPLANT
CHLORAPREP W/TINT 26ML (MISCELLANEOUS) ×4 IMPLANT
CLAMP CORD UMBIL (MISCELLANEOUS) ×2 IMPLANT
CLOSURE STERI STRIP 1/2 X4 (GAUZE/BANDAGES/DRESSINGS) ×1 IMPLANT
CLOTH BEACON ORANGE TIMEOUT ST (SAFETY) ×2 IMPLANT
DRSG OPSITE POSTOP 4X10 (GAUZE/BANDAGES/DRESSINGS) ×2 IMPLANT
ELECT REM PT RETURN 9FT ADLT (ELECTROSURGICAL) ×2
ELECTRODE REM PT RTRN 9FT ADLT (ELECTROSURGICAL) ×1 IMPLANT
EXTRACTOR VACUUM KIWI (MISCELLANEOUS) ×2 IMPLANT
GLOVE BIOGEL PI IND STRL 6 (GLOVE) ×1 IMPLANT
GLOVE BIOGEL PI IND STRL 6.5 (GLOVE) ×1 IMPLANT
GLOVE BIOGEL PI IND STRL 7.0 (GLOVE) ×2 IMPLANT
GLOVE BIOGEL PI INDICATOR 6 (GLOVE) ×1
GLOVE BIOGEL PI INDICATOR 6.5 (GLOVE) ×1
GLOVE BIOGEL PI INDICATOR 7.0 (GLOVE) ×2
GOWN STRL REUS W/TWL LRG LVL3 (GOWN DISPOSABLE) ×4 IMPLANT
KIT ABG SYR 3ML LUER SLIP (SYRINGE) IMPLANT
MAT PREVALON FULL STRYKER (MISCELLANEOUS) ×1 IMPLANT
NDL HYPO 25X5/8 SAFETYGLIDE (NEEDLE) IMPLANT
NEEDLE HYPO 25X5/8 SAFETYGLIDE (NEEDLE) IMPLANT
NS IRRIG 1000ML POUR BTL (IV SOLUTION) ×2 IMPLANT
PACK C SECTION WH (CUSTOM PROCEDURE TRAY) ×2 IMPLANT
PAD OB MATERNITY 4.3X12.25 (PERSONAL CARE ITEMS) ×2 IMPLANT
RETRACTOR WND ALEXIS 25 LRG (MISCELLANEOUS) ×1 IMPLANT
RTRCTR WOUND ALEXIS 25CM LRG (MISCELLANEOUS) ×2
STRIP CLOSURE SKIN 1/2X4 (GAUZE/BANDAGES/DRESSINGS) IMPLANT
SUT MNCRL 0 VIOLET CTX 36 (SUTURE) ×2 IMPLANT
SUT MONOCRYL 0 CTX 36 (SUTURE) ×2
SUT PLAIN 2 0 XLH (SUTURE) IMPLANT
SUT VIC AB 0 CT1 36 (SUTURE) ×4 IMPLANT
SUT VIC AB 2-0 CT1 27 (SUTURE) ×1
SUT VIC AB 2-0 CT1 TAPERPNT 27 (SUTURE) IMPLANT
SUT VIC AB 3-0 CT1 27 (SUTURE)
SUT VIC AB 3-0 CT1 TAPERPNT 27 (SUTURE) IMPLANT
SUT VIC AB 4-0 KS 27 (SUTURE) ×1 IMPLANT
SUT VIC AB 4-0 PS2 27 (SUTURE) ×2 IMPLANT
TOWEL OR 17X24 6PK STRL BLUE (TOWEL DISPOSABLE) ×2 IMPLANT
TRAY FOLEY W/BAG SLVR 14FR LF (SET/KITS/TRAYS/PACK) IMPLANT
WATER STERILE IRR 1000ML POUR (IV SOLUTION) ×2 IMPLANT

## 2021-10-05 NOTE — Anesthesia Procedure Notes (Signed)
Spinal  Patient location during procedure: OR Start time: 10/05/2021 5:37 PM End time: 10/05/2021 5:40 PM Reason for block: surgical anesthesia Staffing Performed: anesthesiologist  Anesthesiologist: Elmer Picker, MD Preanesthetic Checklist Completed: patient identified, IV checked, risks and benefits discussed, surgical consent, monitors and equipment checked, pre-op evaluation and timeout performed Spinal Block Patient position: sitting Prep: DuraPrep and site prepped and draped Patient monitoring: cardiac monitor, continuous pulse ox and blood pressure Approach: midline Location: L3-4 Injection technique: single-shot Needle Needle type: Pencan  Needle gauge: 24 G Needle length: 9 cm Assessment Sensory level: T6 Events: CSF return Additional Notes Functioning IV was confirmed and monitors were applied. Sterile prep and drape, including hand hygiene and sterile gloves were used. The patient was positioned and the spine was prepped. The skin was anesthetized with lidocaine.  Free flow of clear CSF was obtained prior to injecting local anesthetic into the CSF.  The spinal needle aspirated freely following injection.  The needle was carefully withdrawn.  The patient tolerated the procedure well.

## 2021-10-05 NOTE — Op Note (Signed)
Cesarean Section Procedure Note   Caitlin Downs  10/05/2021  Indications:  macrosomia, poorly controlled DM, GHTN  Pre-operative Diagnosis: Primary Cesearean section pre-gestational Diabestes with Macrosomia, GHTN Post-operative Diagnosis: Same  Procedure: 1ltcs Surgeon: Surgeon(s) and Role:    * Atavia Poppe, Candace Gallus, MD - Primary   Assistants: Dorisann Frames, CNM  Anesthesia: spinal   Procedure Details:  The patient was seen in the Holding Room. The risks, benefits, complications, treatment options, and expected outcomes were discussed with the patient. The patient concurred with the proposed plan, giving informed consent. identified as [name] and the procedure verified as C-Section Delivery. A Time Out was held and the above information confirmed.  After induction of anesthesia, the patient was draped and prepped in the usual sterile manner, foley was draining urine well.  A pfannenstiel incision was made and carried down through the subcutaneous tissue to the fascia. Fascial incision was made and extended transversely. The fascia was separated from the underlying rectus tissue superiorly and inferiorly. The peritoneum was identified and entered. Peritoneal incision was extended longitudinally. Alexis-O retractor placed. The utero-vesical peritoneal reflection was incised transversely and the bladder flap was bluntly freed from the lower uterine segment. A low transverse uterine incision was made. Delivered from cephalic presentation was a viable female infant with vigorous cry. Apgar scores of 8 at one minute and 9 at five minutes. Delayed cord clamping done at 1 minute and baby handed to NICU team in attendance. Cord ph was not sent. Cord blood was obtained for evaluation. The placenta was removed Intact and appeared normal. The uterine outline, tubes and ovaries appeared normal}. The uterine incision was closed with running locked sutures of 0Vicryl. A second imbricating layer was sutured.    Hemostasis was observed. Alexis retractor removed. Peritoneal closure done with 2-0 Vicryl.  The fascia was then reapproximated with running sutures of 0Vicryl. The subcuticular closure was performed using 2-0plain gut. The skin was closed with 4-0Vicryl.   Instrument, sponge, and needle counts were correct prior the abdominal closure and were correct at the conclusion of the case.    Findings:viable female infant, apgars 8/9, normal appearing uterus, nml tubes/ov bilat   Estimated Blood Loss:   Total IV Fluids:   Urine Output:  175CC OF clear urine  Specimens: none  Complications: no complications  Disposition: PACU - hemodynamically stable.   Maternal Condition: stable   Baby condition / location:  Couplet care / Skin to Skin  Attending Attestation: I performed the procedure.   Signed: Surgeon(s): Amado Nash Candace Gallus, MD

## 2021-10-05 NOTE — Anesthesia Preprocedure Evaluation (Signed)
Anesthesia Evaluation  Patient identified by MRN, date of birth, ID band Patient awake    Reviewed: Allergy & Precautions, NPO status , Patient's Chart, lab work & pertinent test results  Airway Mallampati: III  TM Distance: >3 FB Neck ROM: Full    Dental no notable dental hx.    Pulmonary neg pulmonary ROS,    Pulmonary exam normal breath sounds clear to auscultation       Cardiovascular negative cardio ROS Normal cardiovascular exam Rhythm:Regular Rate:Normal     Neuro/Psych negative neurological ROS  negative psych ROS   GI/Hepatic negative GI ROS, Neg liver ROS,   Endo/Other  diabetes, Type obesity (BMI 44)  Renal/GU negative Renal ROS  negative genitourinary   Musculoskeletal negative musculoskeletal ROS (+)   Abdominal   Peds  Hematology negative hematology ROS (+)   Anesthesia Other Findings   Reproductive/Obstetrics (+) Pregnancy                             Anesthesia Physical Anesthesia Plan  ASA: 3  Anesthesia Plan: Spinal   Post-op Pain Management:    Induction: Intravenous  PONV Risk Score and Plan: Treatment may vary due to age or medical condition  Airway Management Planned: Natural Airway  Additional Equipment:   Intra-op Plan:   Post-operative Plan:   Informed Consent: I have reviewed the patients History and Physical, chart, labs and discussed the procedure including the risks, benefits and alternatives for the proposed anesthesia with the patient or authorized representative who has indicated his/her understanding and acceptance.     Dental advisory given  Plan Discussed with: CRNA  Anesthesia Plan Comments:         Anesthesia Quick Evaluation

## 2021-10-05 NOTE — Lactation Note (Signed)
This note was copied from a baby's chart. Lactation Consultation Note Mom declines Lactation services.  Patient Name: Caitlin Downs [id number] Date: 10/05/2021   Age:34 hours  Maternal Data    Feeding    LATCH Score                    Lactation Tools Discussed/Used    Interventions    Discharge    Consult Status Consult Status: Complete    Charyl Dancer 10/05/2021, 7:42 PM

## 2021-10-05 NOTE — H&P (Addendum)
Caitlin Downs is a 36 y.o. G4P1021 at [redacted]w[redacted]d gestation presents from office d/t new diagnosis of GHTN, pt plans elective c/s for delivery d/t macrosomia.  She denies lof/vb/ctx.  +FM   Antepartum course:  pre-gestational DM, poorly controlled in last few weeks, metformin; macrosomia with efw 99%;    [facility]are at [place] since 10 wks.  See complete pre-natal records  History OB History     Gravida  4   Para  1   Term  1   Preterm      AB  2   Living  1      SAB  1   IAB  1   Ectopic      Multiple      Live Births  1          Past Medical History:  Diagnosis Date   Complication of anesthesia    Diabetes mellitus without complication (HCC)    Type 2   Infertility, female    PCOS (polycystic ovarian syndrome)    Past Surgical History:  Procedure Laterality Date   DILATION AND CURETTAGE OF UTERUS     Family History: family history includes Arthritis in her mother; COPD in her mother; Diabetes in her brother and father; Heart attack (age of onset: 35) in her father; Heart disease in her father and mother; Hypertension in her brother, brother, and mother. Social History:  reports that she has never smoked. She has never used smokeless tobacco. She reports that she does not drink alcohol and does not use drugs.  ROS: See above otherwise negative  Prenatal labs:  ABO, Rh: --/--/O POS (05/30 1600) Antibody: NEG (05/30 1600) Rubella: Immune (11/22 0000) RPR:   neg HBsAg: Negative (11/22 0000)  HIV:Non-reactive (11/22 0000)  GBS:   neg 1 hr Glucola: pre-gestational DM Genetic screening: Normal Anatomy US: Normal  Physical Exam:     Blood pressure 137/81, pulse 99, temperature 98.5 F (36.9 C), temperature source Oral, resp. rate 18, height 5\' 7"  (1.702 m), weight 125.9 kg, last menstrual period 01/09/2021, SpO2 100 %, currently breastfeeding. A&O x 3 HEENT: Normal Lungs: CTAB CV: RRR Abdominal: Soft, Non-tender, Gravid, and Estimated fetal  weight: 9lbs lbs  Lower Extremities: Non-edematous, Non-tender  Pelvic Exam:      deferred  Labs:  CBC:  Lab Results  Component Value Date   WBC 9.8 10/05/2021   RBC 4.43 10/05/2021   HGB 13.8 10/05/2021   HCT 37.6 10/05/2021   MCV 84.9 10/05/2021   MCH 31.2 10/05/2021   MCHC 36.7 (H) 10/05/2021   RDW 14.5 10/05/2021   PLT 206 10/05/2021   CMP:  Lab Results  Component Value Date   NA 138 10/05/2021   K 2.9 (L) 10/05/2021   CL 104 10/05/2021   CO2 22 10/05/2021   GLUCOSE 94 10/05/2021   BUN 5 (L) 10/05/2021   CREATININE 0.49 10/05/2021   CALCIUM 8.7 (L) 10/05/2021   PROT 6.5 10/05/2021   AST 40 10/05/2021   ALT 27 10/05/2021   ALBUMIN 3.2 (L) 10/05/2021   ALKPHOS 98 10/05/2021   BILITOT 0.7 10/05/2021   GFRNONAA >60 10/05/2021   ANIONGAP 12 10/05/2021   Urine: No results found for: COLORURINE, APPEARANCEUR, LABSPEC, PHURINE, GLUCOSEU, HGBUR, BILIRUBINUR, KETONESUR, PROTEINUR, NITRITE, LEUKOCYTESUR   Prenatal Transfer Tool  Maternal Diabetes: Yes:  Diabetes Type:  Pre-pregnancy Genetic Screening: Normal Maternal Ultrasounds/Referrals: Normal Fetal Ultrasounds or other Referrals:  None Maternal Substance Abuse:  No Significant Maternal Medications:  Meds  include: Other: metformin Significant Maternal Lab Results: Group B Strep negative    Assessment/Plan:  36 y.o. G4P1021 at [redacted]w[redacted]d gestation    GHTN - labs wnl, not c/w pre-eclampsia; mild range in office today and last wk, no meds; here for delivery; pt has been counseled in r/b/a c/s vs IOL/svd d/t LGA 99%; pt understands risk of shoulder dystocia and does not want this risk; she accepts risk of surgery which have been discussed; risks reviewed including bleeding, infection, risk of injury to bowel, bladder,nerves, blood vessels, risk of further surgery, risk of anesthesia, risk of blood clot to leg/lung; proceed to OR, consent signed Pre-gestational DM: nml a1c at onset of pregnancy but difficulty controlling  last few weeks; nml fetal echo, nml ekg; plan to resume pre-pregnancy metformin of 500mg  po bid and will follow bs; was just increased to metformin 1000mg  am, 15000mg  hs LGA - efw at 99%, 3879g at 36.4 wga Gbs neg RH pos H/o fetus with cornelia de lange, s/p termination; nml NIPT and normal anatomy u/s      Charyl Bigger 10/05/2021, 5:08 PM

## 2021-10-05 NOTE — Transfer of Care (Signed)
Immediate Anesthesia Transfer of Care Note  Patient: Caitlin Downs  Procedure(s) Performed: CESAREAN SECTION  Patient Location: PACU  Anesthesia Type:Spinal  Level of Consciousness: awake, alert  and oriented  Airway & Oxygen Therapy: Patient Spontanous Breathing  Post-op Assessment: Report given to RN and Post -op Vital signs reviewed and stable  Post vital signs: Reviewed and stable  Last Vitals:  Vitals Value Taken Time  BP 150/84 10/05/21 1915  Temp 37.1 C 10/05/21 1907  Pulse 101 10/05/21 1917  Resp 17 10/05/21 1917  SpO2 97 % 10/05/21 1917  Vitals shown include unvalidated device data.  Last Pain:  Vitals:   10/05/21 1907  TempSrc: Oral  PainSc: 0-No pain         Complications: No notable events documented.

## 2021-10-05 NOTE — Anesthesia Postprocedure Evaluation (Signed)
Anesthesia Post Note  Patient: Caitlin Downs  Procedure(s) Performed: CESAREAN SECTION     Patient location during evaluation: PACU Anesthesia Type: Spinal Level of consciousness: awake and alert Pain management: pain level controlled Vital Signs Assessment: post-procedure vital signs reviewed and stable Respiratory status: spontaneous breathing, nonlabored ventilation and respiratory function stable Cardiovascular status: blood pressure returned to baseline and stable Postop Assessment: no apparent nausea or vomiting Anesthetic complications: no   No notable events documented.  Last Vitals:  Vitals:   10/05/21 1915 10/05/21 1930  BP: (!) 150/84 (!) 146/94  Pulse: (!) 103 (!) 109  Resp: (!) 23 (!) 23  Temp:    SpO2: 97% 95%    Last Pain:  Vitals:   10/05/21 1930  TempSrc:   PainSc: 0-No pain   Pain Goal:    LLE Motor Response: Purposeful movement (10/05/21 1930)   RLE Motor Response: Purposeful movement (10/05/21 1930)   L Sensory Level: L3-Anterior knee, lower leg (10/05/21 1930) R Sensory Level: L3-Anterior knee, lower leg (10/05/21 1930) Epidural/Spinal Function Cutaneous sensation: Able to Discern Pressure (10/05/21 1930), Patient able to flex knees: No (10/05/21 1930), Patient able to lift hips off bed: No (10/05/21 1930), Back pain beyond tenderness at insertion site: No (10/05/21 1930), Progressively worsening motor and/or sensory loss: No (10/05/21 1930), Bowel and/or bladder incontinence post epidural: No (10/05/21 1930)  Lynda Rainwater

## 2021-10-06 ENCOUNTER — Encounter (HOSPITAL_COMMUNITY): Payer: Self-pay | Admitting: Obstetrics and Gynecology

## 2021-10-06 DIAGNOSIS — E119 Type 2 diabetes mellitus without complications: Secondary | ICD-10-CM

## 2021-10-06 LAB — RPR: RPR Ser Ql: NONREACTIVE

## 2021-10-06 LAB — CBC
HCT: 28.9 % — ABNORMAL LOW (ref 36.0–46.0)
Hemoglobin: 10.5 g/dL — ABNORMAL LOW (ref 12.0–15.0)
MCH: 30.5 pg (ref 26.0–34.0)
MCHC: 36.3 g/dL — ABNORMAL HIGH (ref 30.0–36.0)
MCV: 84 fL (ref 80.0–100.0)
Platelets: 194 10*3/uL (ref 150–400)
RBC: 3.44 MIL/uL — ABNORMAL LOW (ref 3.87–5.11)
RDW: 14.2 % (ref 11.5–15.5)
WBC: 14.1 10*3/uL — ABNORMAL HIGH (ref 4.0–10.5)
nRBC: 0 % (ref 0.0–0.2)

## 2021-10-06 LAB — GLUCOSE, CAPILLARY: Glucose-Capillary: 102 mg/dL — ABNORMAL HIGH (ref 70–99)

## 2021-10-06 MED ORDER — SCOPOLAMINE 1 MG/3DAYS TD PT72: 1.0000 | MEDICATED_PATCH | Freq: Once | TRANSDERMAL | Status: DC

## 2021-10-06 MED ORDER — NALOXONE HCL 4 MG/10ML IJ SOLN: 1.0000 ug/kg/h | INTRAVENOUS | Status: DC | PRN

## 2021-10-06 MED ORDER — ONDANSETRON HCL 4 MG/2ML IJ SOLN: 4.0000 mg | Freq: Three times a day (TID) | INTRAMUSCULAR | Status: DC | PRN

## 2021-10-06 MED ORDER — DIPHENHYDRAMINE HCL 50 MG/ML IJ SOLN: 12.5000 mg | INTRAMUSCULAR | Status: DC | PRN

## 2021-10-06 MED ORDER — DIPHENHYDRAMINE HCL 25 MG PO CAPS: 25.0000 mg | ORAL_CAPSULE | ORAL | Status: DC | PRN

## 2021-10-06 MED ORDER — ACETAMINOPHEN 500 MG PO TABS: 1000.0000 mg | ORAL_TABLET | Freq: Four times a day (QID) | ORAL | Status: DC

## 2021-10-06 MED ORDER — SODIUM CHLORIDE 0.9% FLUSH: 3.0000 mL | INTRAVENOUS | Status: DC | PRN

## 2021-10-06 MED ORDER — NALOXONE HCL 0.4 MG/ML IJ SOLN: 0.4000 mg | INTRAMUSCULAR | Status: DC | PRN

## 2021-10-06 NOTE — Progress Notes (Signed)
   Subjective: POD# 1 Live born female  Birth Weight: 8 lb 15.9 oz (4080 g) APGAR: 8, 9  Newborn Delivery   Birth date/time: 10/05/2021 18:11:00 Delivery type: C-Section, Low Transverse Trial of labor: No C-section categorization: Primary     Baby name: [place] Delivering provider: Rhoderick Moody E   circumcision planned Feeding: breast  Pain control at delivery: Spinal   Reports feeling well  Patient reports tolerating PO.   Breast symptoms:working on latch Pain controlled with  PO meds Denies HA/SOB/C/P/N/V/dizziness. Flatus absent. She reports vaginal bleeding as normal, without clots.  She has ambulated in room, foley cath in place and draining amber urine.  Objective:   VS:    Vitals:   10/05/21 2257 10/05/21 2338 10/06/21 0356 10/06/21 0810  BP: 131/81 133/83 121/84 115/69  Pulse: 85 87 98 77  Resp: 20 20 18 20   Temp: 98.8 F (37.1 C) 98.4 F (36.9 C) 98.5 F (36.9 C)   TempSrc: Oral Oral Oral Oral  SpO2: 97% 98% 97% 99%  Weight:      Height:         Intake/Output Summary (Last 24 hours) at 10/06/2021 0840 Last data filed at 10/06/2021 0646 Gross per 24 hour  Intake 2532.54 ml  Output 1908 ml  Net 624.54 ml        Recent Labs    10/05/21 1605 10/06/21 0428  WBC 9.8 14.1*  HGB 13.8 10.5*  HCT 37.6 28.9*  PLT 206 194   CBG (last 3)  Recent Labs    10/05/21 1610 10/05/21 1926 10/06/21 0537  GLUCAP 92 139* 102*     Blood type: --/--/O POS (05/30 1600)  Rubella: Immune (11/22 0000)  Vaccines: TDaP          UTD   Physical Exam:  General: alert, cooperative, and no distress CV: Regular rate and rhythm Resp: clear Abdomen: soft, nontender, normal bowel sounds Incision: clean, dry, and intact Uterine Fundus: firm, below umbilicus, nontender Lochia: minimal Ext: trace pedal edema, no redness or tenderness in the calves or thighs  Assessment/Plan: 36 y.o.   POD# 1. 20                  Principal Problem:   Postpartum care following  cesarean delivery 5/30 Active Problems:   Status post primary low transverse cesarean section 5/30   Gestational hypertension  - stable BP in high normal range, no PEC s/sx, labs wnl  - monitor for diuresis, anticipate improvement with increased mobility   Macrosomia   Diabetes mellitus, type 2 (HCC)  - CBG mildly elevated on metformin 500 mg BID  - continue regimen as established and monitoring, expect CBG's to normalize now that delivered  Doing well, stable.               Advance diet as tolerated Encourage rest when baby rests Breastfeeding support Encourage to ambulate Routine post-op care  6/30, CNM, MSN 10/06/2021, 8:40 AM

## 2021-10-07 MED ORDER — ACETAMINOPHEN 500 MG PO TABS
1000.0000 mg | ORAL_TABLET | Freq: Four times a day (QID) | ORAL | 0 refills | Status: AC
Start: 2021-10-07 — End: ?

## 2021-10-07 MED ORDER — OXYCODONE HCL 5 MG PO TABS: 5.0000 mg | ORAL_TABLET | Freq: Four times a day (QID) | ORAL | 0 refills | Status: AC | PRN

## 2021-10-07 MED ORDER — COCONUT OIL OIL: 1.0000 "application " | TOPICAL_OIL | 0 refills | Status: AC | PRN

## 2021-10-07 MED ORDER — IBUPROFEN 600 MG PO TABS
600.0000 mg | ORAL_TABLET | Freq: Four times a day (QID) | ORAL | 0 refills | Status: AC
Start: 2021-10-07 — End: ?

## 2021-10-07 MED ORDER — SENNOSIDES-DOCUSATE SODIUM 8.6-50 MG PO TABS: 2.0000 | ORAL_TABLET | Freq: Every day | ORAL | Status: AC

## 2021-10-07 MED ORDER — SIMETHICONE 80 MG PO CHEW: 80.0000 mg | CHEWABLE_TABLET | ORAL | 0 refills | Status: AC | PRN

## 2021-10-07 NOTE — Discharge Summary (Signed)
OB Discharge Summary  Patient Name: Caitlin Downs DOB: 01/05/1986 MRN: [id number]  Date of admission: 10/05/2021 Delivering provider: Rhoderick Moody E   Admitting diagnosis: Gestational hypertension [O13.9] Diabetes mellitus, type 2 (HCC) [E11.9] Intrauterine pregnancy: [redacted]w[redacted]d     Secondary diagnosis: Patient Active Problem List   Diagnosis Date Noted   Diabetes mellitus, type 2 (HCC) 10/06/2021   Status post primary low transverse cesarean section 5/30 10/05/2021   Gestational hypertension 10/05/2021   Type 2 diabetes mellitus affecting pregnancy in third trimester, antepartum 10/05/2021   Macrosomia 10/05/2021   Postpartum care following cesarean delivery 5/30 10/05/2021   Additional problems:none   Date of discharge: 10/07/2021   Discharge diagnosis: Principal Problem:   Postpartum care following cesarean delivery 5/30 Active Problems:   Status post primary low transverse cesarean section 5/30   Gestational hypertension   Type 2 diabetes mellitus affecting pregnancy in third trimester, antepartum   Macrosomia   Diabetes mellitus, type 2 (HCC)                                                              Post partum procedures: none  Augmentation: N/A Pain control: Spinal  Laceration:None  Episiotomy:None  Complications: None  Hospital course:  Sceduled C/S   36 y.o. yo [id number] at [redacted]w[redacted]d was admitted to the hospital 10/05/2021 for scheduled cesarean section with the following indication:Macrosomia.Delivery details are as follows:  Membrane Rupture Time/Date: 6:11 PM ,10/05/2021   Delivery Method:C-Section, Low Transverse  Details of operation can be found in separate operative note.  Patient had an uncomplicated postpartum course.  She is ambulating, tolerating a regular diet, passing flatus, and urinating well. Patient is discharged home in stable condition on  10/07/21        Newborn Data: Birth date:10/05/2021  Birth time:6:11 PM  Gender:Female  Living status:Living   Apgars:8 ,9  Weight:4080 g     Physical exam  Vitals:   10/06/21 1511 10/06/21 2047 10/07/21 0548 10/07/21 0643  BP: 111/66 121/76 139/86 121/76  Pulse: 67 86 89 89  Resp: 18 17 16    Temp: 98.2 F (36.8 C) 98.3 F (36.8 C) 98.4 F (36.9 C)   TempSrc: Oral Oral Oral   SpO2:  98% 98%   Weight:      Height:       General: alert, cooperative, and no distress Lochia: appropriate Uterine Fundus: firm Incision: Dressing is clean, dry, and intact DVT Evaluation: No cords or calf tenderness. No significant calf/ankle edema. Labs: Lab Results  Component Value Date   WBC 14.1 (H) 10/06/2021   HGB 10.5 (L) 10/06/2021   HCT 28.9 (L) 10/06/2021   MCV 84.0 10/06/2021   PLT 194 10/06/2021      Latest Ref Rng & Units 10/05/2021    4:05 PM  CMP  Glucose 70 - 99 mg/dL 94    BUN 6 - 20 mg/dL 5    Creatinine 10/07/2021 - 1.00 mg/dL 6.06    Sodium 3.01 - 601 mmol/L 138    Potassium 3.5 - 5.1 mmol/L 2.9    Chloride 98 - 111 mmol/L 104    CO2 22 - 32 mmol/L 22    Calcium 8.9 - 10.3 mg/dL 8.7    Total Protein 6.5 - 8.1 g/dL 6.5    Total Bilirubin  0.3 - 1.2 mg/dL 0.7    Alkaline Phos 38 - 126 U/L 98    AST 15 - 41 U/L 40    ALT 0 - 44 U/L 27        10/06/2021   10:00 PM  Edinburgh Postnatal Depression Scale Screening Tool  I have been able to laugh and see the funny side of things. 0  I have looked forward with enjoyment to things. 0  I have blamed myself unnecessarily when things went wrong. 0  I have been anxious or worried for no good reason. 0  I have felt scared or panicky for no good reason. 0  Things have been getting on top of me. 0  I have been so unhappy that I have had difficulty sleeping. 0  I have felt sad or miserable. 0  I have been so unhappy that I have been crying. 0  The thought of harming myself has occurred to me. 0  Edinburgh Postnatal Depression Scale Total 0   Vaccines: TDaP          UTD       Discharge instruction:  per After Visit Summary,  [facility]  booklet and  "Understanding Mother & Baby Care" hospital booklet  After Visit Meds:  Allergies as of 10/07/2021   No Known Allergies      Medication List     STOP taking these medications    aspirin EC 81 MG tablet       TAKE these medications    acetaminophen 500 MG tablet Commonly known as: TYLENOL Take 2 tablets (1,000 mg total) by mouth every 6 (six) hours. What changed:  medication strength how much to take when to take this reasons to take this   cetirizine 10 MG tablet Commonly known as: ZYRTEC Take 10 mg by mouth daily.   coconut oil Oil Apply 1 application. topically as needed.   ibuprofen 600 MG tablet Commonly known as: ADVIL Take 1 tablet (600 mg total) by mouth every 6 (six) hours.   metFORMIN 500 MG tablet Commonly known as: GLUCOPHAGE Take 500 mg by mouth 2 (two) times daily with a meal.   oxyCODONE 5 MG immediate release tablet Commonly known as: Oxy IR/ROXICODONE Take 1 tablet (5 mg total) by mouth every 6 (six) hours as needed for up to 5 days for moderate pain.   PRENATAL VITAMINS PO Take by mouth.   senna-docusate 8.6-50 MG tablet Commonly known as: Senokot-S Take 2 tablets by mouth daily. Start taking on: October 08, 2021   simethicone 80 MG chewable tablet Commonly known as: MYLICON Chew 1 tablet (80 mg total) by mouth as needed for flatulence.        Diet: routine diet  Activity: Advance as tolerated. Pelvic rest for 6 weeks.   Postpartum contraception: TBA in office  Newborn Data: Live born female  Birth Weight: 8 lb 15.9 oz (4080 g) APGAR: 8, 9  Newborn Delivery   Birth date/time: 10/05/2021 18:11:00 Delivery type: C-Section, Low Transverse Trial of labor: No C-section categorization: Primary      named [place] Baby Feeding: Breast Disposition:home with mother Circumcision: f/u with urology - [facility]   Delivery Report:  Review the Delivery Report for details.    Follow up:  Follow-up Information      Amado NashAlmquist, Candace GallusSusan E, MD. Schedule an appointment as soon as possible for a visit in 1 week(s).   Specialty: Obstetrics and Gynecology Why: For Postpartum follow-up / BP check Contact information:  [address] [place] [address] [phone]                   Signed: Cipriano Mile, MSN 10/07/2021, 10:53 AM

## 2021-10-07 NOTE — Discharge Instructions (Signed)
Lactation outpatient support - home visit ° ° °Jessica Bowers, IBCLC (lactation consultant)  & Birth Doula ° °Phone (text or call): [phone] °Email: [email or web address] °[email or web address] ° ° °Linda Coppola °RN, MHA, IBCLC °at [facility]: Lactation Consultant ° °[email or web address] °Mail: [email or web address] °Tel: [phone] ° ° °Additional breastfeeding resources: ° °[facility] °https://ibconline.ca/information-sheets/ ° °[employer] [place] ° °www.lllofnc.org ° ° °Other Resources: ° °Chiropractic specialist  ° °Dr. Leanna Hastings °[email or web address] ° ° °Craniosacral therapy for baby ° °Erin Balkind  °[email or web address]://[email or web address]/ ° °

## 2021-10-13 ENCOUNTER — Telehealth (HOSPITAL_COMMUNITY): Payer: Self-pay | Admitting: *Deleted

## 2021-10-13 NOTE — Telephone Encounter (Signed)
Left phone voicemail message.  Duffy Rhody, RN 10-13-2021 at 9:59am

## 2021-12-09 ENCOUNTER — Ambulatory Visit: Payer: BC Managed Care – PPO | Admitting: Cardiology

## 2021-12-18 IMAGING — US US MFM OB DETAIL+14 WK
1 series · 13 of 28 positions shown · non-contrast
Comparison: none

[Series 1: us mfm ob detail+14 wk · 136 acquisitions, 13 frames shown]
[im 6/136]
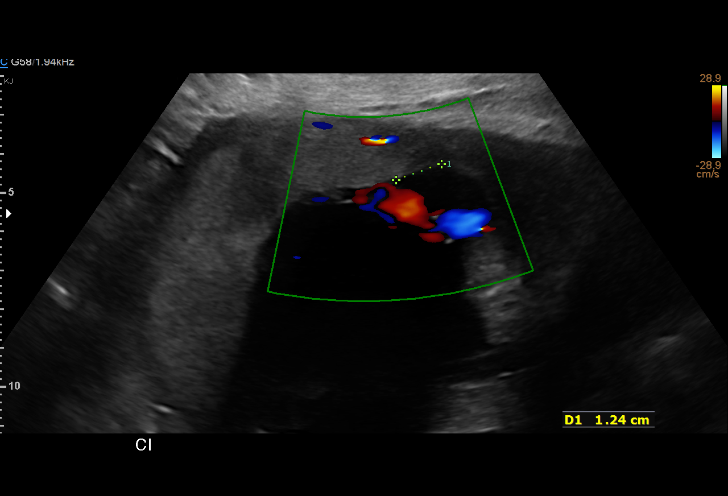
[im 16/136]
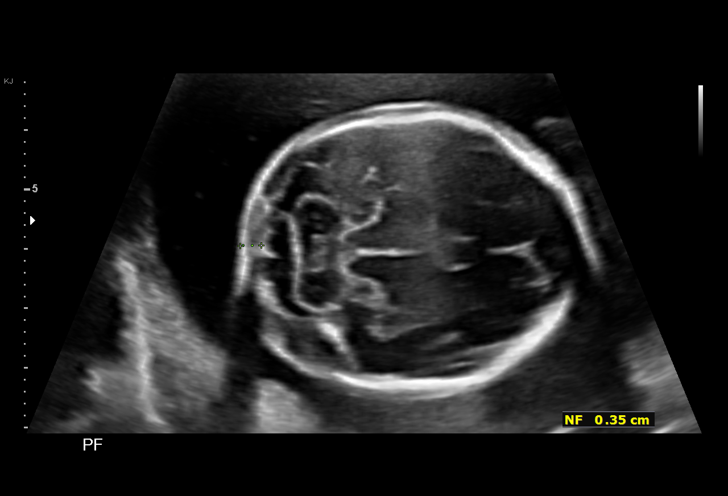
[im 26/136]
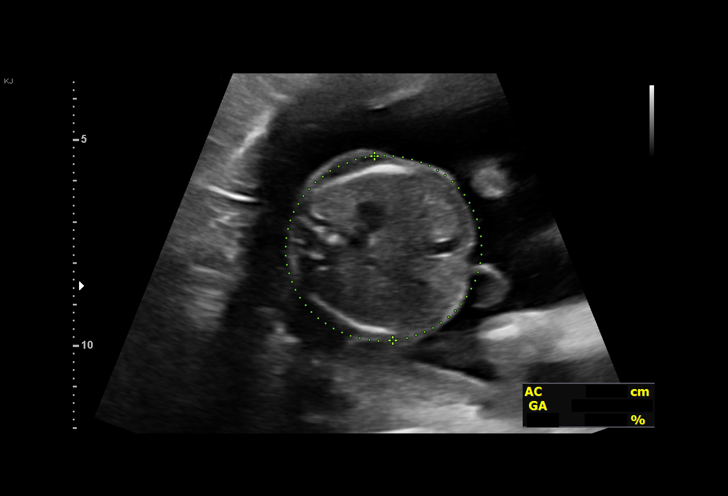
[im 36/136]
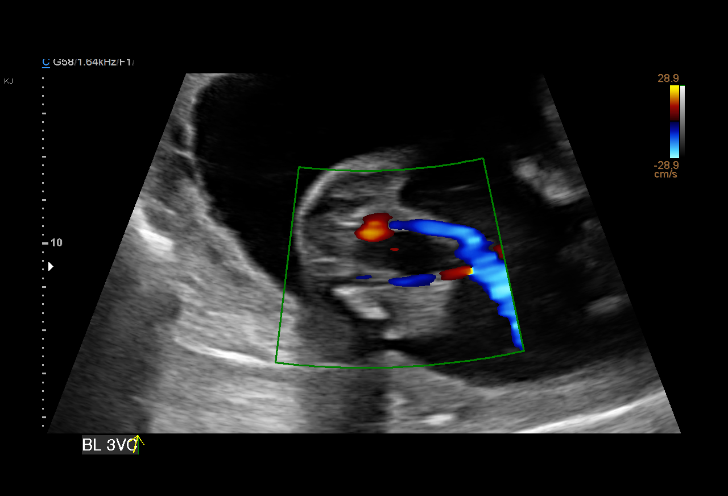
[im 46/136]
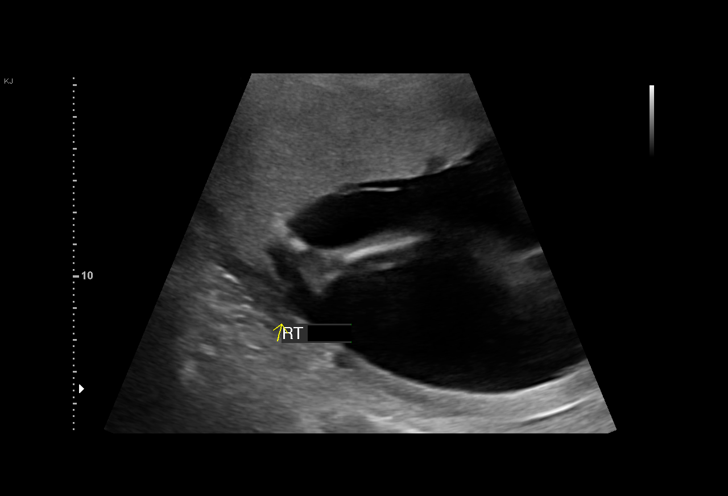
[im 56/136]
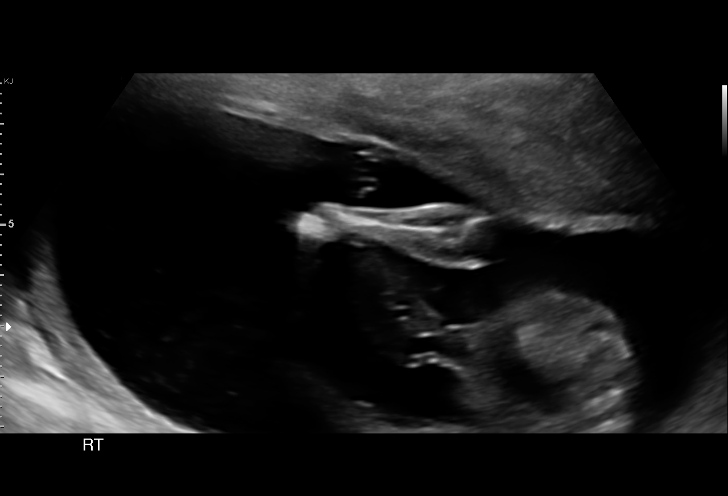
[im 71/136]
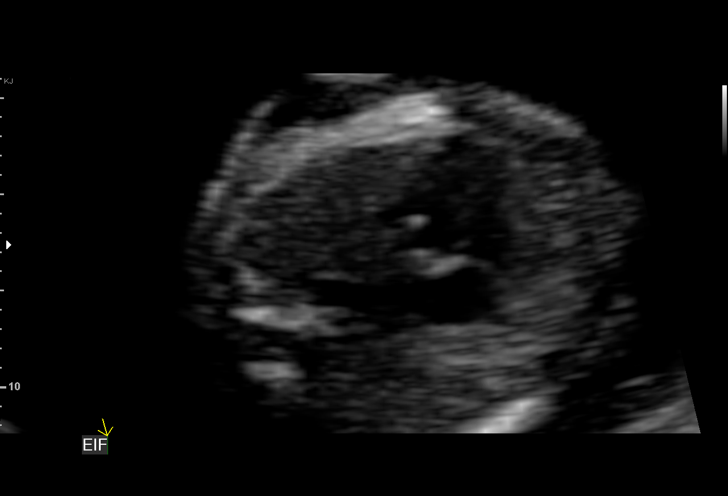
[im 81/136]
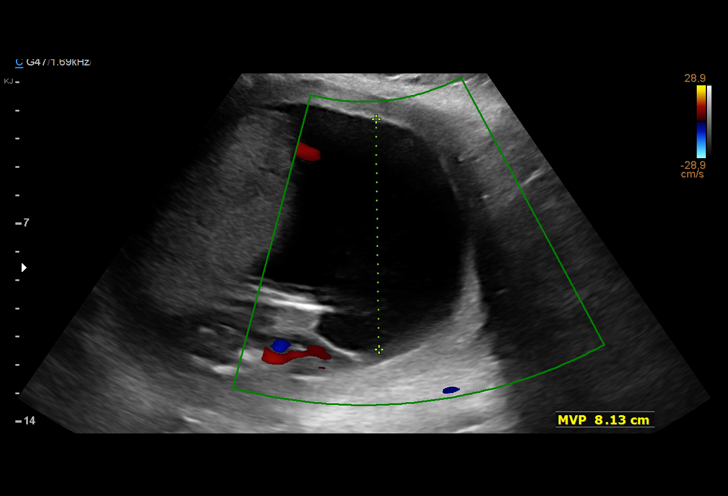
[im 91/136]
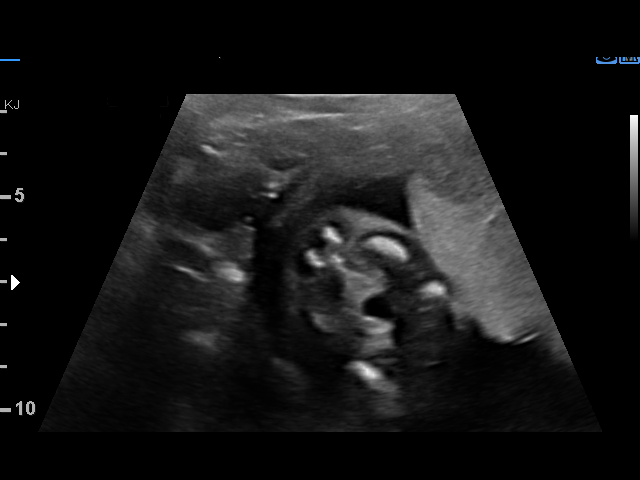
[im 101/136]
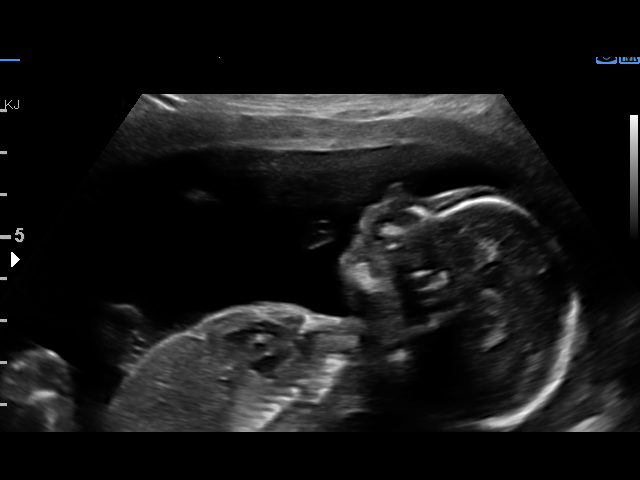
[im 111/136]
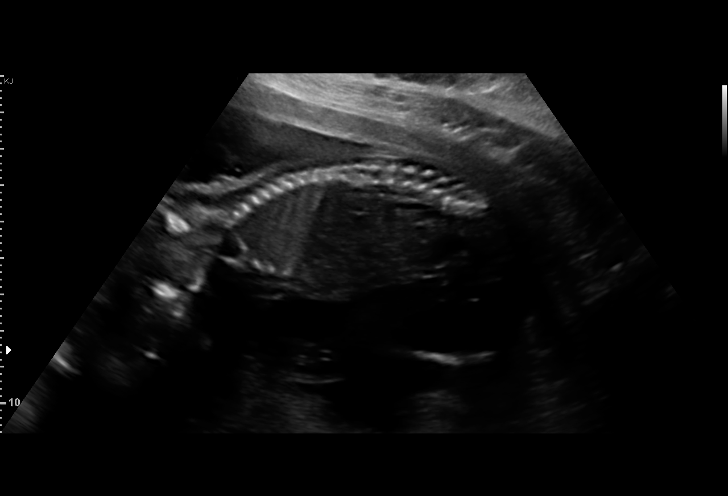
[im 121/136]
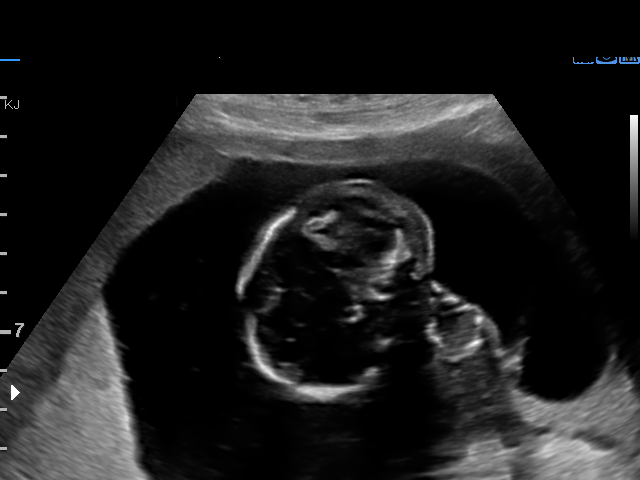
[im 131/136]
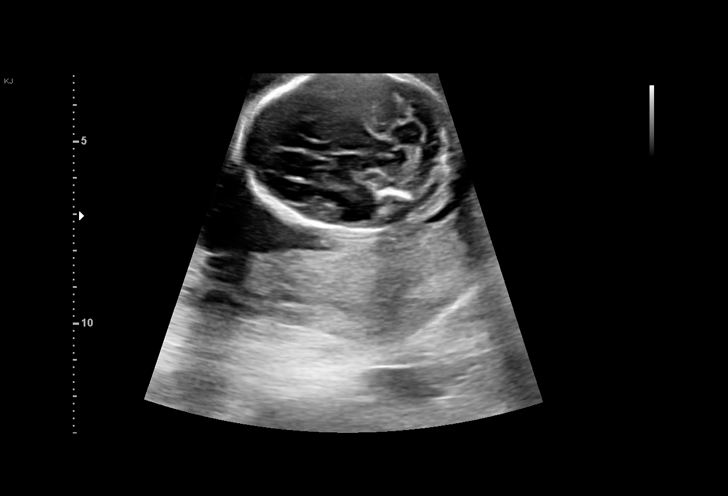

[13 of 28 positions shown; findings below may reference images not displayed]

Indications

 Fetal abnormality - absent right hand on
 office scan
 Pre-existing diabetes, type 2, in pregnancy,
 second trimester - metformin
 Obesity complicating pregnancy, second
 trimester (BMI 33 pregravid)
 LR NIPS, Neg AFP
 20 weeks gestation of pregnancy
Fetal Evaluation

 Num Of Fetuses:         1
 Fetal Heart Rate(bpm):  150
 Cardiac Activity:       Observed
 Presentation:           Cephalic
 Placenta:               Anterior
 P. Cord Insertion:      Marginal insertion

 Amniotic Fluid
 AFI FV:      Polyhydramnios

                             Largest Pocket(cm)

Biometry

 BPD:      44.3  mm     G. Age:  19w 3d         20  %    CI:        66.72   %    70 - 86
                                                         FL/HC:      15.9   %    16.8 -
 HC:      173.8  mm     G. Age:  19w 6d         32  %    HC/AC:      1.21        1.09 -
 AC:      143.5  mm     G. Age:  19w 5d         29  %    FL/BPD:     62.5   %
 FL:       27.7  mm     G. Age:  18w 3d        3.5  %    FL/AC:      19.3   %    20 - 24
 HUM:      27.8  mm     G. Age:  18w 6d         20  %
 CER:      21.3  mm     G. Age:  20w 1d         73  %
 NFT:       3.5  mm

 LV:          6  mm
 CM:        4.4  mm

 Est. FW:     280  gm    0 lb 10 oz       8  %
OB History

 Gravidity:    3         Term:   1         SAB:   1
 Living:       1
Gestational Age

 LMP:           20w 1d        Date:  02/19/20                 EDD:   11/25/20
 U/S Today:     19w 3d                                        EDD:   11/30/20
 Best:          20w 1d     Det. By:  LMP  (02/19/20)          EDD:   11/25/20
Anatomy

 Cranium:               Appears normal         LVOT:                   Appears normal
 Cavum:                 Appears normal         Aortic Arch:            Not well visualized
 Ventricles:            Appears normal         Ductal Arch:            Not well visualized
 Choroid Plexus:        Appears normal         Diaphragm:              Appears normal
 Cerebellum:            Appears normal         Stomach:                Appears normal, left
                                                                       sided
 Posterior Fossa:       Appears normal         Abdomen:                Appears normal
 Nuchal Fold:           Appears normal         Abdominal Wall:         Appears nml (cord
                                                                       insert, abd wall)
 Face:                  Micrognathia           Cord Vessels:           Appears normal (3
                                                                       vessel cord)
 Lips:                  Not well visualized    Kidneys:                Not well visualized
 Palate:                Not well visualized    Bladder:                Appears normal
 Thoracic:              Appears normal         Spine:                  Appears normal
 Heart:                 VSD, membranous        Upper Extremities:      Absent right hand
 RVOT:                  Appears normal         Lower Extremities:      Appears normal

 Other:  Fetus appears to be a male. Heels visualized. Nasal bone visualized.
         SVC IVC 3VV/T not well visualized.
Cervix Uterus Adnexa

 Cervix
 Length:           3.95  cm.
 Normal appearance by transabdominal scan.

 Adnexa
 No abnormality visualized.
Impression

 Ms. Hoi Wing is a G3P1 who is here at 20 w 0 d for a detailed
 anatomy for suggested absent right hand observed on an
 outside examination.

 She had a low risk NIPS and AFP per her report.

 Today we observed fetal growth restriction with an EFW of
 8% with normal UA Dopplers with no evidence of AEDF or
 REDF.

 In addition we observed the following findings:
 Micrognathia
 Membranous VSD
 Polyhydramnios
 Absent right had with unla and radius present
 Marginal cord insertion

 I reviewed with Ms. Bambucafe Tarla findings. I discussed that
 these are likely syndrome related the differential diagnosis is
 broad but it includes diabetic embryopathy ( less likely
 hgba1c 5.6% prior to conception), Trisomy 18, and Darian
 Gyussz Trajer syndrome. We discussed that a diagnostic
 amniocentesis is recommended given today's findings. We
 reviewed the [DATE] to [DATE] increased risk for perinatal loss
 to include bleeeding, infection and premature rupture of
 membranes. In addition she met with our genetic counselor
 who discussed her genetic history and counseled her
 regarding the risk to the procedure, testing modalities as well
 as the above listed syndromes.

 We reiterated that it uncertain what etiology of today's
 findings and that there is broad variation of
 neurodevelopmental delay in each of the sydromes leading to
 stillbirth during pregnancy to fetal growth delay with survival.

 In addition, we would recommend pediatric cardiology fetal
 echocardiogram if she continues the pregnancy.

 At the conclusion of our counseling Ms. Hoi Wing desired
 amniocentesis but desired to wait to process the decision
 with her husband.
Recommendations

 Follow up per Ms. Hoi Wing preference.
 Consider repeat growth in 4 weeks.

## 2022-11-05 IMAGING — US US MFM OB DETAIL+14 WK
1 series · 14 of 28 positions shown · non-contrast
Comparison: none

[Series 1: us mfm ob detail+14 wk · 175 acquisitions, 14 frames shown]
[im 7/175]
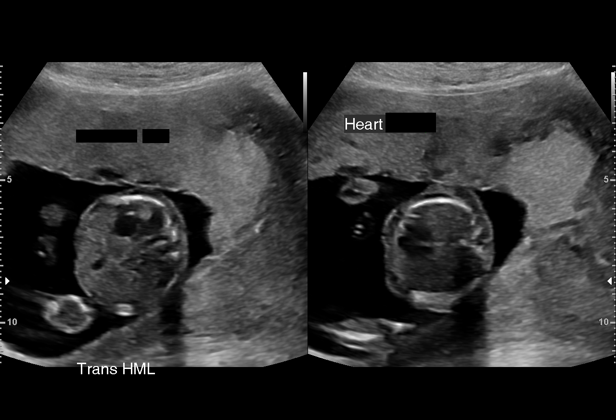
[im 20/175]
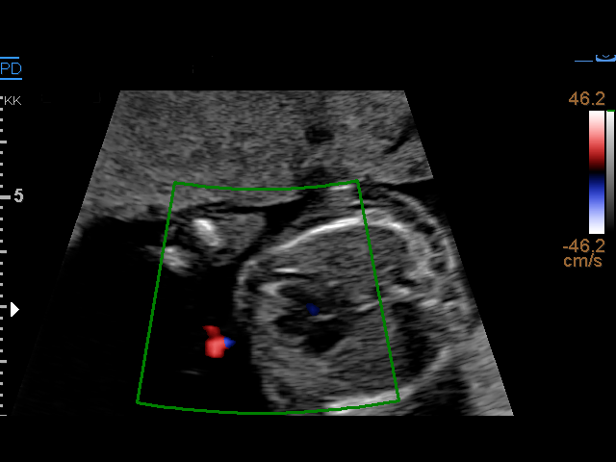
[im 33/175]
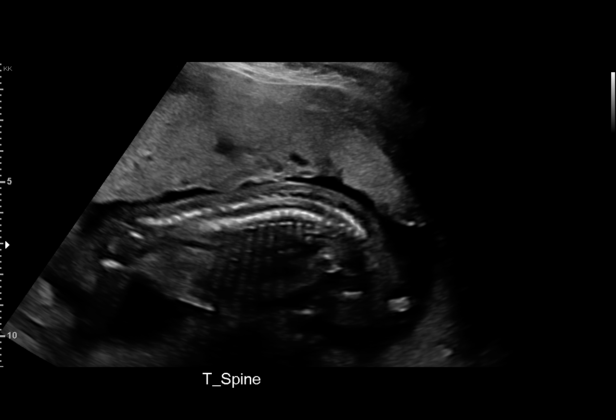
[im 46/175]
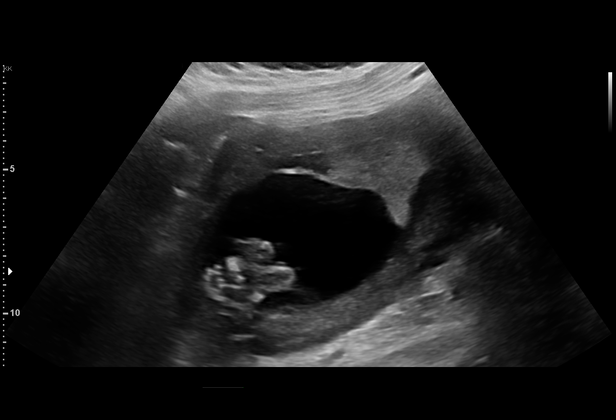
[im 59/175]
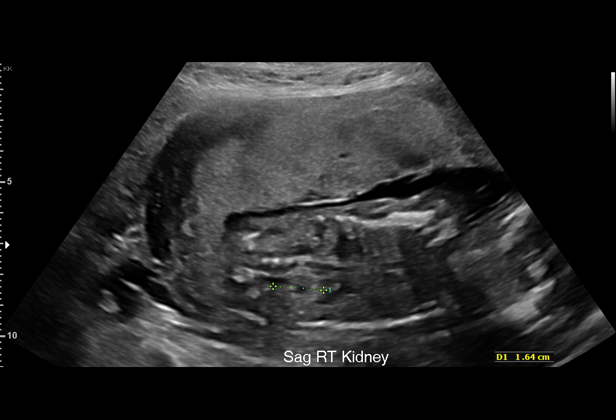
[im 71/175]
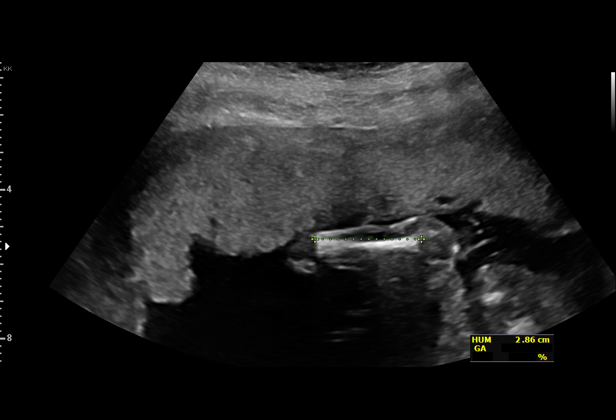
[im 84/175]
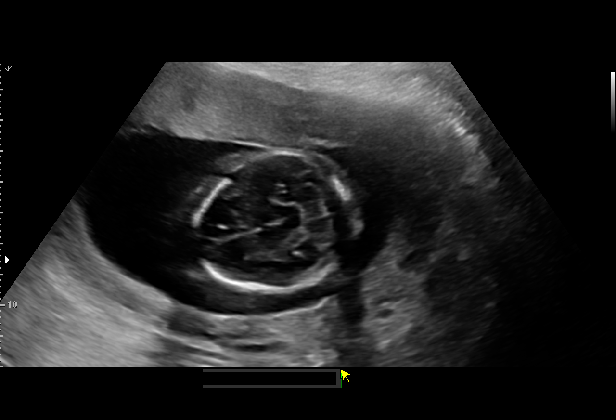
[im 97/175]
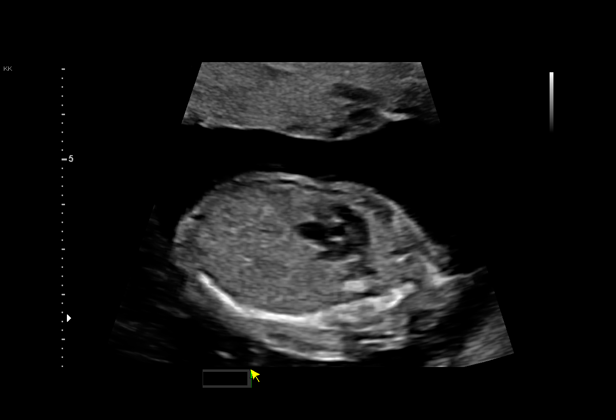
[im 110/175]
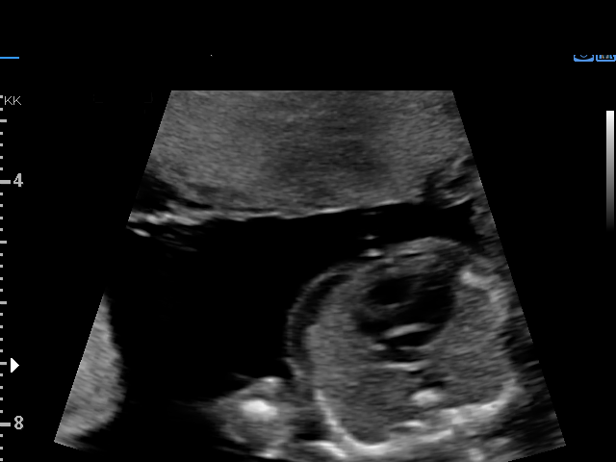
[im 123/175]
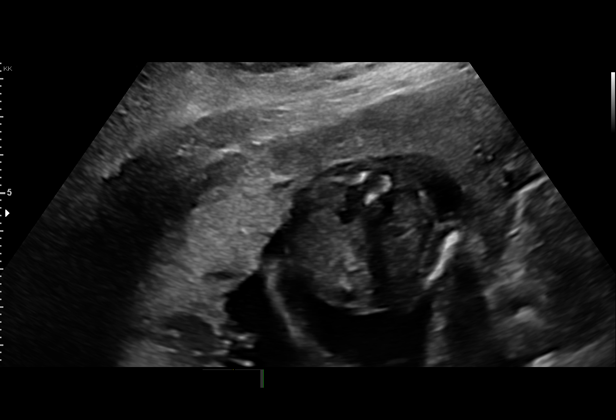
[im 136/175]
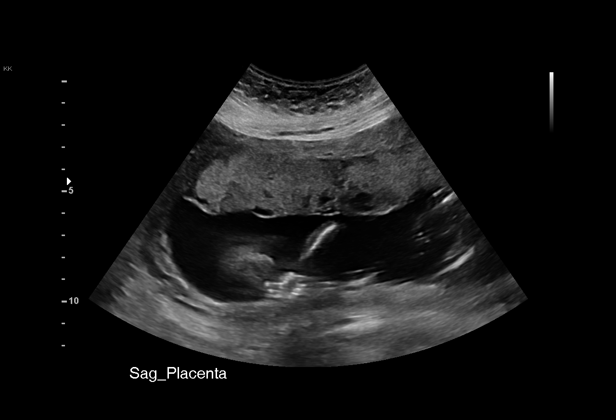
[im 149/175]
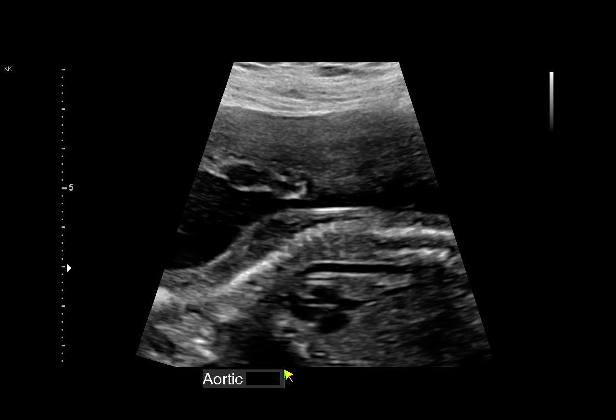
[im 162/175]
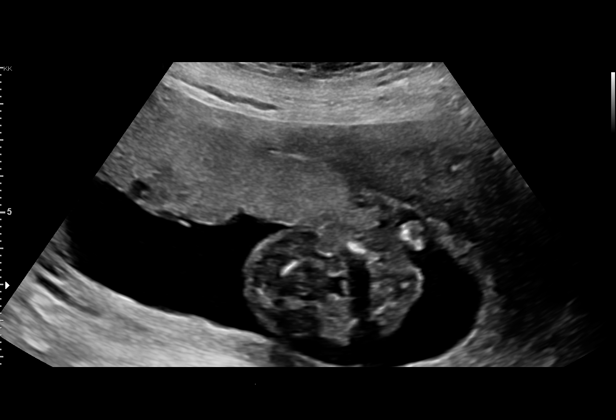
[im 175/175]
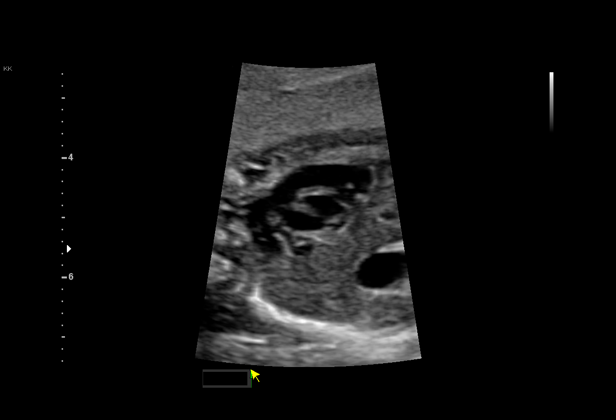

[14 of 28 positions shown; findings below may reference images not displayed]

[id number] [HOSPITAL]

Indications

 Diabetes - Pregestational, 2nd trimester
 Fetal choroid plexus cyst
 Obesity complicating pregnancy, second
 trimester (BMI 33.6)
 Poor obstetrical history (fetal anomaly)
 18 weeks gestation of pregnancy
 Encounter for antenatal screening for
 malformations
 Low Risk NIPS
Fetal Evaluation

 Num Of Fetuses:         1
 Fetal Heart Rate(bpm):  141
 Cardiac Activity:       Observed
 Presentation:           Transverse, head to maternal left
 Placenta:               Anterior
 P. Cord Insertion:      Visualized

 Amniotic Fluid
 AFI FV:      Within normal limits

                             Largest Pocket(cm)

Biometry
 BPD:      43.9  mm     G. Age:  19w 2d         84  %    CI:        76.26   %    70 - 86
                                                         FL/HC:      18.0   %    15.8 - 18
 HC:      159.3  mm     G. Age:  18w 5d         60  %    HC/AC:      1.15        1.07 -
 AC:      138.2  mm     G. Age:  19w 2d         73  %    FL/BPD:     65.1   %
 FL:       28.6  mm     G. Age:  18w 5d         57  %    FL/AC:      20.7   %    20 - 24
 HUM:      28.6  mm     G. Age:  19w 2d         76  %
 CER:      19.4  mm     G. Age:  18w 6d         69  %
 NFT:       2.3  mm

 LV:        5.8  mm
 CM:        2.3  mm

 Est. FW:     269  gm      0 lb 9 oz     80  %
OB History

 Gravidity:    4         Term:   1        Prem:   0        SAB:   1
 TOP:          1       Ectopic:  0        Living: 1
Gestational Age

 LMP:           19w 5d        Date:  01/09/21                 EDD:   10/16/21
 U/S Today:     19w 0d                                        EDD:   10/21/21
 Best:          18w 3d     Det. By:  Early Ultrasound         EDD:   10/25/21
                                     (03/11/21)
Anatomy

 Cranium:               Appears normal         Aortic Arch:            Appears normal
 Cavum:                 Appears normal         Ductal Arch:            Appears normal
 Ventricles:            Appears normal         Diaphragm:              Appears normal
 Choroid Plexus:        Bilateral choroid      Stomach:                Appears normal, left
                        plexus cysts
                                                                       sided
 Cerebellum:            Appears normal         Abdomen:                Appears normal
 Posterior Fossa:       Appears normal         Abdominal Wall:         Appears nml (cord
                                                                       insert, abd wall)
 Nuchal Fold:           Appears normal         Cord Vessels:           Appears normal (3
                                                                       vessel cord)
 Face:                  Appears normal         Kidneys:                Appear normal
                        (orbits and profile)
 Lips:                  Appears normal         Bladder:                Appears normal
 Thoracic:              Appears normal         Spine:                  Appears normal
 Heart:                 Appears normal         Upper Extremities:      Appears normal
                        (4CH, axis, and
                        situs)
 RVOT:                  Appears normal         Lower Extremities:      Appears normal
 LVOT:                  Appears normal

 Other:  Fetus appears to be a male.Hands visualized. Technically difficult due
         to fetal position.
Cervix Uterus Adnexa

 Cervix
 Normal appearance by transabdominal scan.
Impression

 We performed fetal anatomical survey.  Amniotic fluid is
 normal and good fetal activity seen.  Fetal biometry is
 consistent with the previously established dates.  Bilateral
 choroid plexus cysts were seen.  No other markers of
 aneuploidies or fetal structural defects are seen.  Both arms
 were visible and appeared normal.  Fetal profile appears
 normal.
 xxxxxxxxxxxxxxxxxxxxxxxxxxxxxxxxxxxxxxxxxxxxxxxxx
 Consultation (see [REDACTED] )

 I had the pleasure of [facility]. [facility] She is G4 BDWUD at 19w 5d gestation
 and is here for fetal anatomy scan and consultation.
 Her problems include:
 -Pregestational diabetes.
 -History of fetal anomaly

 Past medical history significant for type 2 diabetes.  In
 March 2019, her hemoglobin A1c was 9.4%.  Her most
 recent hemoglobin A1c (January 2021) is 5.5%.  Patient
 takes metformin XR 500 mg daily and reports her fasting and
 postprandial levels are within normal range.  She checks her
 blood glucose regularly.  Patient had ophthalmology
 examination last year and does not have proliferative
 retinopathy.  She does not have neuropathy or nephropathy.
 She does not have hypertension or thyroid disorders or any
 other chronic medical conditions.
 Past surgical history: ERXLEBEN
 Medications: Prenatal vitamins, metformin, Zyrtec as needed.
 Allergies: No known drug allergies.
 Social history: Denies tobacco or drug or alcohol use.  She
 has been married 10 years and her husband is in good
 health.  He is a father of her first child.
 Family history: No history of venous thromboembolism in the
 family.
 Obstetric history
 [DATE]: Term vaginal delivery of a female infant weighing 6
 pounds and 2 ounces at birth.  Her daughter is in good health.
 -In July 2020 at 20 weeks gestation, multiple fetal
 anomalies including micrognathia, ventricular septal defect,
 polyhydramnios, absent right ulnar and radius were seen.
 Patient had opted not to have amniocentesis.  She had D&E
 procedure performed at [facility].  She
 declined postnatal genetic studies.
 GYN history: No history of abnormal Pap smears or cervical
 surgeries.

 Prenatal course: Her pregnancy is well dated by 7-week
 ultrasound performed at your office.  On cell free fetal DNA
 screening, the risks of fetal aneuploidies are not increased.

 Blood pressure today at her office is 124/69 mmHg.

 Pregestational diabetes in pregnancy
 -Diabetes is antedated this pregnancy.  Increased
 hemoglobin A1c in March 2019 clearly establishes
 pregestational diabetes and not gestational diabetes.
 -I reassured the patient that low hemoglobin A1c is
 associated with a lower incidence of congenital
 malformations (2% to 3%).  I recommended fetal
 echocardiography.  Patient reports that your office had set up
 an appointment for fetal echocardiography.
 -I discussed the importance of checking her blood glucose
 regularly and discussed the normal parameters.  Patient
 seems very well motivated.
 -Complications of poorly controlled diabetes include fetal
 macrosomia leading to shoulder dystocia and birth injuries,
 neonatal respiratory distress syndrome and NICU
 admissions.  Poorly controlled diabetes can also lead to
 stillbirth.
 -Metformin can be safely given in pregnancy and if diabetes
 is not well controlled on oral hypoglycemics, insulin should be
 initiated.
 -I discussed our ultrasound protocol of serial fetal growth
 assessments and weekly BPP from 32 weeks gestation till
 delivery.
 -Delivery should be considered at 39 weeks gestation
 provided diabetes is well controlled.  If diabetes is not well
 controlled, early term delivery (37- or 38-weeks gestation)
 may be considered.
 -Type 2 diabetes associated with increased risk of gestational
 hypertension/preeclampsia.  I discussed the benefit of low-
 dose aspirin in delaying or preventing preeclampsia.  I
 encouraged her to take aspirin 81 mg daily till delivery.

 Bilateral choroid plexus cysts (CPC)
 I counseled the patient that isolated CPC is only rarely
 associated with chromosomal anomaly (trisomy 18). I also
 reassured her that CPC is not associated with structural
 malformations in the brain. CPCs usually resolve with
 advancing gestation.  Given that she had low risk for trisomy
 18 on cell free fetal DNA screening, this should not be
 considered a marker for trisomy 18.  I do not recommend
 amniocentesis for this finding.
 I reassured the patient of normal otherwise fetal anatomical
 survey today.
Recommendations

 -An appointment was made for her to return in 4 weeks for
 fetal growth assessment and revisit facial anatomy (normal
 but suboptimal views on today's ultrasound).
 -Fetal growth assessments every 4 weeks that may be
 performed at your office.
 -Weekly BPP from 32 weeks gestation till delivery.
 -Aspirin 81 mg daily till delivery.
 -Your office as requested appointment for fetal
 echocardiography.  Kindly fax us the reports when available.
                 Pavel, Emme
